# Patient Record
Sex: Male | Born: 1970 | Race: Black or African American | Hispanic: No | Marital: Single | Smoking: Never smoker
Health system: Southern US, Community
[De-identification: ages and names within clinical notes are randomized; demographics above are authoritative.]

## PROBLEM LIST (undated history)

## (undated) DIAGNOSIS — M549 Dorsalgia, unspecified: Secondary | ICD-10-CM

## (undated) DIAGNOSIS — E785 Hyperlipidemia, unspecified: Secondary | ICD-10-CM

## (undated) DIAGNOSIS — I10 Essential (primary) hypertension: Secondary | ICD-10-CM

## (undated) DIAGNOSIS — N529 Male erectile dysfunction, unspecified: Secondary | ICD-10-CM

## (undated) HISTORY — DX: Hyperlipidemia, unspecified: E78.5

## (undated) HISTORY — PX: HEMORROIDECTOMY: SUR656

## (undated) HISTORY — DX: Male erectile dysfunction, unspecified: N52.9

## (undated) HISTORY — DX: Dorsalgia, unspecified: M54.9

## (undated) HISTORY — PX: VASECTOMY: SHX75

## (undated) HISTORY — DX: Essential (primary) hypertension: I10

---

## 2012-10-04 ENCOUNTER — Telehealth: Payer: Self-pay | Admitting: Physician Assistant

## 2012-10-04 NOTE — Telephone Encounter (Signed)
Return call

## 2012-10-04 NOTE — Telephone Encounter (Signed)
Boil has come back. wtbs

## 2012-10-05 ENCOUNTER — Telehealth: Payer: Self-pay | Admitting: Nurse Practitioner

## 2012-10-05 ENCOUNTER — Encounter: Payer: Self-pay | Admitting: Physician Assistant

## 2012-10-05 ENCOUNTER — Ambulatory Visit: Payer: Self-pay | Admitting: Physician Assistant

## 2012-10-05 ENCOUNTER — Ambulatory Visit (INDEPENDENT_AMBULATORY_CARE_PROVIDER_SITE_OTHER): Payer: BC Managed Care – PPO | Admitting: Physician Assistant

## 2012-10-05 VITALS — BP 128/87 | HR 77 | Temp 97.1°F | Ht 73.0 in | Wt 272.8 lb

## 2012-10-05 DIAGNOSIS — L0390 Cellulitis, unspecified: Secondary | ICD-10-CM

## 2012-10-05 MED ORDER — CIPROFLOXACIN HCL 500 MG PO TABS: 500.0000 mg | ORAL_TABLET | Freq: Two times a day (BID) | ORAL | Status: DC

## 2012-10-05 MED ORDER — DOXYCYCLINE HYCLATE 100 MG PO TABS: 100.0000 mg | ORAL_TABLET | Freq: Two times a day (BID) | ORAL | Status: DC

## 2012-10-05 NOTE — Telephone Encounter (Signed)
Pt was checking out and he told me he needed his blood pressure rx refilled.  He has only 3 left. Pt uses Psychologist, forensic.  He doesn't remember the name of the rx

## 2012-10-05 NOTE — Progress Notes (Signed)
  Subjective:    Patient ID: Logan Smith, male    DOB: [birth year], 42 y.o.   MRN: [id number]  HPI    Review of Systems  Skin:       Right axilla starting swell and get tender  All other systems reviewed and are negative.       Objective:   Physical Exam  Skin:  Right axilla 3 cm lesion vesicular head on erythematous base          Assessment & Plan:  Cellulitis right axilla Meds ordered this encounter  Medications  . benazepril-hydrochlorthiazide (LOTENSIN HCT) 10-12.5 MG per tablet    Sig: Take 1 tablet by mouth daily.  . sildenafil (VIAGRA) 100 MG tablet    Sig: Take 100 mg by mouth daily as needed for erectile dysfunction.  Marland Kitchen amLODipine (NORVASC) 5 MG tablet    Sig: Take 5 mg by mouth daily.  . rosuvastatin (CRESTOR) 10 MG tablet    Sig: Take 10 mg by mouth daily.  . fish oil-omega-3 fatty acids 1000 MG capsule    Sig: Take 2 g by mouth daily.  . Multiple Vitamins-Minerals (MENS 50+ MULTI VITAMIN/MIN PO)    Sig: Take by mouth.  . ciprofloxacin (CIPRO) 500 MG tablet    Sig: Take 1 tablet (500 mg total) by mouth 2 (two) times daily.    Dispense:  20 tablet    Refill:  0    Order Specific Question:  Supervising Provider    Answer:  Ernestina Penna [[id number]]  . doxycycline (VIBRA-TABS) 100 MG tablet    Sig: Take 1 tablet (100 mg total) by mouth 2 (two) times daily.    Dispense:  20 tablet    Refill:  0    Order Specific Question:  Supervising Provider    Answer:  Ernestina Penna [phone]

## 2012-10-05 NOTE — Telephone Encounter (Signed)
Spoke with patient and told him to come in this morning for evaluation.

## 2012-10-06 NOTE — Telephone Encounter (Signed)
Pt will contact [facility] to send name of meds to [place] for refills

## 2012-11-24 ENCOUNTER — Ambulatory Visit: Payer: Self-pay | Admitting: Nurse Practitioner

## 2013-01-05 ENCOUNTER — Ambulatory Visit (INDEPENDENT_AMBULATORY_CARE_PROVIDER_SITE_OTHER): Payer: BC Managed Care – PPO | Admitting: Nurse Practitioner

## 2013-01-05 ENCOUNTER — Encounter: Payer: Self-pay | Admitting: Nurse Practitioner

## 2013-01-05 VITALS — BP 133/80 | HR 69 | Temp 98.2°F | Ht 73.0 in | Wt 273.0 lb

## 2013-01-05 DIAGNOSIS — I10 Essential (primary) hypertension: Secondary | ICD-10-CM | POA: Insufficient documentation

## 2013-01-05 DIAGNOSIS — N529 Male erectile dysfunction, unspecified: Secondary | ICD-10-CM

## 2013-01-05 DIAGNOSIS — E785 Hyperlipidemia, unspecified: Secondary | ICD-10-CM | POA: Insufficient documentation

## 2013-01-05 LAB — COMPLETE METABOLIC PANEL WITH GFR
ALT: 27 U/L (ref 0–53)
AST: 20 U/L (ref 0–37)
Albumin: 4.2 g/dL (ref 3.5–5.2)
Alkaline Phosphatase: 60 U/L (ref 39–117)
Calcium: 9.3 mg/dL (ref 8.4–10.5)
Chloride: 103 mEq/L (ref 96–112)
Potassium: 3.7 mEq/L (ref 3.5–5.3)
Sodium: 138 mEq/L (ref 135–145)
Total Protein: 6.9 g/dL (ref 6.0–8.3)

## 2013-01-05 MED ORDER — BENAZEPRIL-HYDROCHLOROTHIAZIDE 10-12.5 MG PO TABS: 1.0000 | ORAL_TABLET | Freq: Every day | ORAL | Status: DC

## 2013-01-05 MED ORDER — AMLODIPINE BESYLATE 5 MG PO TABS: 5.0000 mg | ORAL_TABLET | Freq: Every day | ORAL | Status: DC

## 2013-01-05 NOTE — Patient Instructions (Addendum)

## 2013-01-05 NOTE — Progress Notes (Signed)
  Subjective:    Patient ID: Donn Zanetti, male    DOB: 05/30/1971, 42 y.o.   MRN: [id number]  Hypertension This is a chronic problem. The current episode started more than 1 year ago. The problem has been gradually improving since onset. The problem is controlled. Pertinent negatives include no anxiety, blurred vision, chest pain, palpitations or shortness of breath. Risk factors for coronary artery disease include male gender and dyslipidemia. Past treatments include calcium channel blockers, ACE inhibitors and diuretics. There is no history of a thyroid problem.  Hyperlipidemia This is a chronic problem. The current episode started more than 1 year ago. The problem is uncontrolled. Recent lipid tests were reviewed and are high. Pertinent negatives include no chest pain or shortness of breath. Current antihyperlipidemic treatment includes statins. The current treatment provides mild improvement of lipids. Risk factors for coronary artery disease include dyslipidemia, hypertension and male sex.      Review of Systems  Eyes: Negative for blurred vision.  Respiratory: Negative for shortness of breath.   Cardiovascular: Negative for chest pain and palpitations.  All other systems reviewed and are negative.       Objective:   Physical Exam  Constitutional: He is oriented to person, place, and time. He appears well-developed and well-nourished.  HENT:  Head: Normocephalic.  Eyes: Pupils are equal, round, and reactive to light.  Neck: Normal range of motion. Neck supple.  Cardiovascular: Normal rate, regular rhythm, normal heart sounds and intact distal pulses.   Pulmonary/Chest: Effort normal and breath sounds normal.  Abdominal: Soft. Bowel sounds are normal. There is no tenderness.  Musculoskeletal: Normal range of motion. He exhibits no edema.  Neurological: He is alert and oriented to person, place, and time.  Skin: Skin is warm and dry.  Psychiatric: He has a normal mood and  affect. His behavior is normal. Judgment and thought content normal.    BP 133/80  Pulse 69  Temp(Src) 98.2 F (36.8 C) (Oral)  Ht 6\' 1"  (1.854 m)  Wt 273 lb (123.832 kg)  BMI 36.03 kg/m2       Assessment & Plan:   1. Hypertension   2. Hyperlipemia   3. ED (erectile dysfunction)    Orders Placed This Encounter  Procedures  . COMPLETE METABOLIC PANEL WITH GFR  . NMR Lipoprofile with Lipids   Meds ordered this encounter  Medications  . amLODipine (NORVASC) 5 MG tablet    Sig: Take 1 tablet (5 mg total) by mouth daily.    Dispense:  30 tablet    Refill:  5    Order Specific Question:  Supervising Provider    Answer:  Ernestina Penna [[id number]]  . benazepril-hydrochlorthiazide (LOTENSIN HCT) 10-12.5 MG per tablet    Sig: Take 1 tablet by mouth daily.    Dispense:  30 tablet    Refill:  5    Order Specific Question:  Supervising Provider    Answer:  Ernestina Penna [[id number]]   Continue all meds Crestor samples given Diet and exercsie encouraged Follow up in 3 months  Mary-Margaret Daphine Deutscher, FNP

## 2013-01-08 LAB — NMR LIPOPROFILE WITH LIPIDS
HDL Size: 8.4 nm — ABNORMAL LOW (ref >=9.2)
LDL Particle Number: 1034 nmol/L — ABNORMAL HIGH (ref <1000)
LDL Size: 19.8 nm — ABNORMAL LOW (ref >20.5)
Large HDL-P: <1.3 umol/L — ABNORMAL LOW (ref >=4.8)
Large VLDL-P: 1.8 nmol/L (ref <=2.7)

## 2013-01-08 NOTE — Progress Notes (Signed)
Quick Note:  Pt aware ______ 

## 2013-01-10 ENCOUNTER — Telehealth: Payer: Self-pay | Admitting: Nurse Practitioner

## 2013-01-11 NOTE — Telephone Encounter (Signed)
LMOM, none of his  BP meds available in samples.

## 2013-01-29 ENCOUNTER — Other Ambulatory Visit: Payer: Self-pay | Admitting: *Deleted

## 2013-01-29 MED ORDER — ROSUVASTATIN CALCIUM 10 MG PO TABS: 10.0000 mg | ORAL_TABLET | Freq: Every day | ORAL | Status: DC

## 2013-03-16 ENCOUNTER — Encounter: Payer: Self-pay | Admitting: Physician Assistant

## 2013-03-16 ENCOUNTER — Ambulatory Visit (INDEPENDENT_AMBULATORY_CARE_PROVIDER_SITE_OTHER): Payer: BC Managed Care – PPO | Admitting: Physician Assistant

## 2013-03-16 VITALS — BP 134/88 | HR 88 | Temp 97.6°F | Ht 73.0 in | Wt 265.0 lb

## 2013-03-16 DIAGNOSIS — R05 Cough: Secondary | ICD-10-CM

## 2013-03-16 DIAGNOSIS — R059 Cough, unspecified: Secondary | ICD-10-CM

## 2013-03-16 NOTE — Progress Notes (Signed)
  Subjective:    Patient ID: Logan Smith, male    DOB: 1971/01/15, 42 y.o.   MRN: [id number]  HPI  42 y/o male presents for cough x 2 weeks. No aggravating or relieving factors. He would also like his BP checked.      Review of Systems  Constitutional: Negative.   HENT: Negative.   Eyes: Negative.   Respiratory: Positive for cough (nonproductive).   Cardiovascular: Negative.        Objective:   Physical Exam  Constitutional: He appears well-developed and well-nourished. No distress.  HENT:  Head: Normocephalic and atraumatic.  Cardiovascular: Normal rate and regular rhythm.   Pulmonary/Chest: Effort normal and breath sounds normal. No respiratory distress. He has no wheezes.  Skin: He is not diaphoretic.          Assessment & Plan:  1. Seasonal Allergies: Suggested taking OTC antihistamine such as CLaritin, Zyrtec. Work note given

## 2013-04-09 ENCOUNTER — Encounter: Payer: Self-pay | Admitting: Nurse Practitioner

## 2013-04-09 ENCOUNTER — Ambulatory Visit (INDEPENDENT_AMBULATORY_CARE_PROVIDER_SITE_OTHER): Payer: BC Managed Care – PPO | Admitting: Nurse Practitioner

## 2013-04-09 VITALS — BP 128/82 | HR 75 | Temp 97.0°F | Ht 73.0 in | Wt 278.0 lb

## 2013-04-09 DIAGNOSIS — E785 Hyperlipidemia, unspecified: Secondary | ICD-10-CM

## 2013-04-09 DIAGNOSIS — Z125 Encounter for screening for malignant neoplasm of prostate: Secondary | ICD-10-CM

## 2013-04-09 DIAGNOSIS — N529 Male erectile dysfunction, unspecified: Secondary | ICD-10-CM

## 2013-04-09 DIAGNOSIS — I10 Essential (primary) hypertension: Secondary | ICD-10-CM

## 2013-04-09 MED ORDER — HYDROCORTISONE 2.5 % RE CREA: TOPICAL_CREAM | Freq: Two times a day (BID) | RECTAL | Status: DC

## 2013-04-09 NOTE — Patient Instructions (Addendum)

## 2013-04-09 NOTE — Progress Notes (Signed)
  Subjective:    Patient ID: Logan Smith, male    DOB: 1970/09/13, 42 y.o.   MRN: [id number]  Hypertension This is a chronic problem. The current episode started more than 1 year ago. The problem has been gradually improving since onset. The problem is controlled. Pertinent negatives include no anxiety, blurred vision, chest pain, palpitations or shortness of breath. Risk factors for coronary artery disease include male gender, dyslipidemia and family history. Past treatments include calcium channel blockers, ACE inhibitors and diuretics. The current treatment provides mild improvement. There is no history of a thyroid problem.  Hyperlipidemia This is a chronic problem. The current episode started more than 1 year ago. The problem is uncontrolled. Recent lipid tests were reviewed and are high. Pertinent negatives include no chest pain or shortness of breath. Current antihyperlipidemic treatment includes statins. The current treatment provides mild improvement of lipids. Risk factors for coronary artery disease include dyslipidemia, hypertension, male sex and family history.   *Pt also has hemorrhoids and has tried OTC with mild relief. Pt still having burning and pain. Would like a RX?   Review of Systems  Eyes: Negative for blurred vision.  Respiratory: Negative for shortness of breath.   Cardiovascular: Negative for chest pain and palpitations.  All other systems reviewed and are negative.       Objective:   Physical Exam  Vitals reviewed. Constitutional: He is oriented to person, place, and time. He appears well-developed and well-nourished.  HENT:  Head: Normocephalic.  Eyes: Pupils are equal, round, and reactive to light.  Neck: Normal range of motion. Neck supple.  Cardiovascular: Normal rate, regular rhythm, normal heart sounds and intact distal pulses.   Pulmonary/Chest: Effort normal and breath sounds normal.  Abdominal: Soft. Bowel sounds are normal. There is no tenderness.   Musculoskeletal: Normal range of motion. He exhibits no edema.  Neurological: He is alert and oriented to person, place, and time.  Skin: Skin is warm and dry.  Psychiatric: He has a normal mood and affect. His behavior is normal. Judgment and thought content normal.    BP 128/82  Pulse 75  Temp(Src) 97 F (36.1 C) (Oral)  Ht 6\' 1"  (1.854 m)  Wt 278 lb (126.1 kg)  BMI 36.69 kg/m2       Assessment & Plan:   1. Hypertension   2. Hyperlipemia   3. ED (erectile dysfunction)   4. Prostate cancer screening    Orders Placed This Encounter  Procedures  . CMP14+EGFR  . NMR, lipoprofile  . PSA, total and free    Continue all meds Labs pending Diet and exercise encouraged Health maintenance reviewed Follow up in 3 months  Mary-Margaret Daphine Deutscher, FNP

## 2013-04-11 LAB — CMP14+EGFR
ALT: 17 IU/L (ref 0–44)
AST: 16 IU/L (ref 0–40)
Alkaline Phosphatase: 72 IU/L (ref 39–117)
BUN/Creatinine Ratio: 12 (ref 9–20)
Chloride: 97 mmol/L (ref 97–108)
GFR calc Af Amer: 131 mL/min/{1.73_m2} (ref >59)
Potassium: 3.8 mmol/L (ref 3.5–5.2)
Sodium: 139 mmol/L (ref 134–144)
Total Bilirubin: 0.4 mg/dL (ref 0.0–1.2)

## 2013-04-11 LAB — NMR, LIPOPROFILE
HDL Particle Number: 33.8 umol/L (ref >=30.5)
LDL Particle Number: 1179 nmol/L — ABNORMAL HIGH (ref <1000)
Small LDL Particle Number: 885 nmol/L — ABNORMAL HIGH (ref <=527)
Triglycerides by NMR: 100 mg/dL (ref <150)

## 2013-04-11 LAB — PSA, TOTAL AND FREE: PSA: 1.7 ng/mL (ref 0.0–4.0)

## 2013-05-17 ENCOUNTER — Other Ambulatory Visit: Payer: Self-pay | Admitting: Nurse Practitioner

## 2013-06-14 ENCOUNTER — Telehealth: Payer: Self-pay | Admitting: Nurse Practitioner

## 2013-06-14 NOTE — Telephone Encounter (Signed)
Tramadol not on llist- what does he need for?

## 2013-06-19 NOTE — Telephone Encounter (Signed)
WE HAVE NOT GIVEN HIM TRMADOL OR MOBIC SINCE BEING ON EPIC- ntbs

## 2013-06-28 NOTE — Telephone Encounter (Signed)
Patient aware has an appointment on the 07/20/13 will call back if needs to be seen sooner

## 2013-07-19 ENCOUNTER — Other Ambulatory Visit: Payer: Self-pay | Admitting: Nurse Practitioner

## 2013-07-20 ENCOUNTER — Ambulatory Visit: Payer: BC Managed Care – PPO | Admitting: Nurse Practitioner

## 2013-07-21 ENCOUNTER — Other Ambulatory Visit: Payer: Self-pay | Admitting: Nurse Practitioner

## 2013-08-03 ENCOUNTER — Ambulatory Visit (INDEPENDENT_AMBULATORY_CARE_PROVIDER_SITE_OTHER): Payer: BC Managed Care – PPO | Admitting: Nurse Practitioner

## 2013-08-03 ENCOUNTER — Encounter (INDEPENDENT_AMBULATORY_CARE_PROVIDER_SITE_OTHER): Payer: Self-pay

## 2013-08-03 ENCOUNTER — Encounter: Payer: Self-pay | Admitting: Nurse Practitioner

## 2013-08-03 VITALS — BP 131/82 | HR 86 | Temp 96.9°F | Ht 73.0 in | Wt 265.0 lb

## 2013-08-03 DIAGNOSIS — N529 Male erectile dysfunction, unspecified: Secondary | ICD-10-CM

## 2013-08-03 DIAGNOSIS — I10 Essential (primary) hypertension: Secondary | ICD-10-CM

## 2013-08-03 DIAGNOSIS — E785 Hyperlipidemia, unspecified: Secondary | ICD-10-CM

## 2013-08-03 MED ORDER — MELOXICAM 15 MG PO TABS: 15.0000 mg | ORAL_TABLET | Freq: Every day | ORAL | Status: DC

## 2013-08-03 NOTE — Progress Notes (Signed)
Subjective:    Patient ID: Logan Smith, male    DOB: 1971/01/10, 43 y.o.   MRN: [id number]  Pateint in today fro follow up- doing well- he went to ER with back pain- was given pain meds and muscle relaxer and still having occasional flareups.  Hypertension This is a chronic problem. The current episode started more than 1 year ago. The problem has been gradually improving since onset. The problem is controlled. Pertinent negatives include no anxiety, blurred vision, chest pain, palpitations or shortness of breath. Risk factors for coronary artery disease include male gender, dyslipidemia and family history. Past treatments include calcium channel blockers, ACE inhibitors and diuretics. The current treatment provides mild improvement. There is no history of a thyroid problem.  Hyperlipidemia This is a chronic problem. The current episode started more than 1 year ago. The problem is uncontrolled. Recent lipid tests were reviewed and are high. Pertinent negatives include no chest pain or shortness of breath. Current antihyperlipidemic treatment includes statins. The current treatment provides mild improvement of lipids. Risk factors for coronary artery disease include dyslipidemia, hypertension, male sex and family history.  Back Pain This is a recurrent problem. The current episode started more than 1 month ago. The problem occurs intermittently. The problem is unchanged. The pain is present in the lumbar spine. The quality of the pain is described as aching. The pain does not radiate. The pain is at a severity of 4/10. The pain is mild. The pain is worse during the night. The symptoms are aggravated by lying down. Pertinent negatives include no chest pain. He has tried muscle relaxant and analgesics for the symptoms. The treatment provided mild relief.    Review of Systems  Eyes: Negative for blurred vision.  Respiratory: Negative for shortness of breath.   Cardiovascular: Negative for chest pain  and palpitations.  Musculoskeletal: Positive for back pain.  All other systems reviewed and are negative.       Objective:   Physical Exam  Vitals reviewed. Constitutional: He is oriented to person, place, and time. He appears well-developed and well-nourished.  HENT:  Head: Normocephalic.  Eyes: Pupils are equal, round, and reactive to light.  Neck: Normal range of motion. Neck supple.  Cardiovascular: Normal rate, regular rhythm, normal heart sounds and intact distal pulses.   Pulmonary/Chest: Effort normal and breath sounds normal.  Abdominal: Soft. Bowel sounds are normal. There is no tenderness.  Musculoskeletal: Normal range of motion. He exhibits no edema.  Neurological: He is alert and oriented to person, place, and time.  Skin: Skin is warm and dry.  Psychiatric: He has a normal mood and affect. His behavior is normal. Judgment and thought content normal.    BP 131/82  Pulse 86  Temp(Src) 96.9 F (36.1 C) (Oral)  Ht '6\' 1"'  (1.854 m)  Wt 265 lb (120.203 kg)  BMI 34.97 kg/m2       Assessment & Plan:   1. Hypertension   2. Hyperlipemia   3. ED (erectile dysfunction)    Orders Placed This Encounter  Procedures  . CMP14+EGFR  . NMR, lipoprofile   Meds ordered this encounter  Medications  . meloxicam (MOBIC) 15 MG tablet    Sig: Take 1 tablet (15 mg total) by mouth daily.    Dispense:  30 tablet    Refill:  2    Order Specific Question:  Supervising Provider    Answer:  Chipper Herb [[id number]]   Back strengthening exercises Labs pending Health maintenance reviewed Diet  and exercise encouraged Continue all meds Follow up  In 3 months   [place], FNP

## 2013-08-03 NOTE — Patient Instructions (Signed)
Back Exercises Back exercises help treat and prevent back injuries. The goal of back exercises is to increase the strength of your abdominal and back muscles and the flexibility of your back. These exercises should be started when you no longer have back pain. Back exercises include:  Pelvic Tilt. Lie on your back with your knees bent. Tilt your pelvis until the lower part of your back is against the floor. Hold this position 5 to 10 sec and repeat 5 to 10 times.  Knee to Chest. Pull first 1 knee up against your chest and hold for 20 to 30 seconds, repeat this with the other knee, and then both knees. This may be done with the other leg straight or bent, whichever feels better.  Sit-Ups or Curl-Ups. Bend your knees 90 degrees. Start with tilting your pelvis, and do a partial, slow sit-up, lifting your trunk only 30 to 45 degrees off the floor. Take at least 2 to 3 seconds for each sit-up. Do not do sit-ups with your knees out straight. If partial sit-ups are difficult, simply do the above but with only tightening your abdominal muscles and holding it as directed.  Hip-Lift. Lie on your back with your knees flexed 90 degrees. Push down with your feet and shoulders as you raise your hips a couple inches off the floor; hold for 10 seconds, repeat 5 to 10 times.  Back arches. Lie on your stomach, propping yourself up on bent elbows. Slowly press on your hands, causing an arch in your low back. Repeat 3 to 5 times. Any initial stiffness and discomfort should lessen with repetition over time.  Shoulder-Lifts. Lie face down with arms beside your body. Keep hips and torso pressed to floor as you slowly lift your head and shoulders off the floor. Do not overdo your exercises, especially in the beginning. Exercises may cause you some mild back discomfort which lasts for a few minutes; however, if the pain is more severe, or lasts for more than 15 minutes, do not continue exercises until you see your caregiver.  Improvement with exercise therapy for back problems is slow.  See your caregivers for assistance with developing a proper back exercise program. Document Released: 08/05/2004 Document Revised: 09/20/2011 Document Reviewed: 04/29/2011 [employer] Patient Information 2014 [employer]  

## 2013-08-05 LAB — CMP14+EGFR
A/G RATIO: 1.7 (ref 1.1–2.5)
ALT: 19 IU/L (ref 0–44)
AST: 20 IU/L (ref 0–40)
Albumin: 4.3 g/dL (ref 3.5–5.5)
Alkaline Phosphatase: 77 IU/L (ref 39–117)
BILIRUBIN TOTAL: 0.4 mg/dL (ref 0.0–1.2)
BUN/Creatinine Ratio: 10 (ref 9–20)
BUN: 10 mg/dL (ref 6–24)
CALCIUM: 9.4 mg/dL (ref 8.7–10.2)
CO2: 25 mmol/L (ref 18–29)
Chloride: 98 mmol/L (ref 97–108)
Creatinine, Ser: 1.01 mg/dL (ref 0.76–1.27)
GFR calc Af Amer: 106 mL/min/{1.73_m2} (ref >59)
GFR, EST NON AFRICAN AMERICAN: 92 mL/min/{1.73_m2} (ref >59)
Globulin, Total: 2.6 g/dL (ref 1.5–4.5)
Glucose: 113 mg/dL — ABNORMAL HIGH (ref 65–99)
POTASSIUM: 3.9 mmol/L (ref 3.5–5.2)
SODIUM: 141 mmol/L (ref 134–144)
Total Protein: 6.9 g/dL (ref 6.0–8.5)

## 2013-08-05 LAB — NMR, LIPOPROFILE
Cholesterol: 112 mg/dL (ref <200)
HDL Cholesterol by NMR: 37 mg/dL — ABNORMAL LOW (ref >=40)
HDL PARTICLE NUMBER: 34.6 umol/L (ref >=30.5)
LDL PARTICLE NUMBER: 919 nmol/L (ref <1000)
LDL SIZE: 19.8 nm — AB (ref >20.5)
LDLC SERPL CALC-MCNC: 47 mg/dL (ref <100)
LP-IR SCORE: 44 (ref <=45)
Small LDL Particle Number: 652 nmol/L — ABNORMAL HIGH (ref <=527)
Triglycerides by NMR: 138 mg/dL (ref <150)

## 2013-08-23 ENCOUNTER — Telehealth: Payer: Self-pay | Admitting: Nurse Practitioner

## 2013-08-24 NOTE — Telephone Encounter (Signed)
No samples at this time

## 2013-08-30 ENCOUNTER — Telehealth: Payer: Self-pay | Admitting: Nurse Practitioner

## 2013-08-30 NOTE — Telephone Encounter (Signed)
Patient aware to pick up 

## 2013-09-18 ENCOUNTER — Telehealth: Payer: Self-pay | Admitting: Nurse Practitioner

## 2013-09-19 MED ORDER — ROSUVASTATIN CALCIUM 10 MG PO TABS: 10.0000 mg | ORAL_TABLET | Freq: Every day | ORAL | Status: DC

## 2013-09-19 NOTE — Telephone Encounter (Signed)
Samples of crestor at front.

## 2013-09-19 NOTE — Telephone Encounter (Signed)
done

## 2013-09-28 ENCOUNTER — Telehealth: Payer: Self-pay | Admitting: Nurse Practitioner

## 2013-09-28 NOTE — Telephone Encounter (Signed)
Nothing else stronger

## 2013-10-03 ENCOUNTER — Other Ambulatory Visit: Payer: Self-pay | Admitting: *Deleted

## 2013-10-03 MED ORDER — BENAZEPRIL-HYDROCHLOROTHIAZIDE 10-12.5 MG PO TABS: ORAL_TABLET | ORAL | Status: DC

## 2013-10-03 MED ORDER — AMLODIPINE BESYLATE 5 MG PO TABS: ORAL_TABLET | ORAL | Status: DC

## 2013-10-03 NOTE — Telephone Encounter (Signed)
Patient advised. Also suggested Tucks pads and sitz bath.

## 2013-10-16 ENCOUNTER — Telehealth: Payer: Self-pay | Admitting: Nurse Practitioner

## 2013-10-16 MED ORDER — HYDROCORTISONE 2.5 % RE CREA: TOPICAL_CREAM | RECTAL | Status: DC

## 2013-10-16 NOTE — Telephone Encounter (Signed)
rx sent to pharmacy

## 2013-11-05 ENCOUNTER — Other Ambulatory Visit: Payer: Self-pay

## 2013-11-05 ENCOUNTER — Telehealth: Payer: Self-pay | Admitting: Nurse Practitioner

## 2013-11-05 MED ORDER — MELOXICAM 15 MG PO TABS: 15.0000 mg | ORAL_TABLET | Freq: Every day | ORAL | Status: DC

## 2013-11-05 NOTE — Telephone Encounter (Signed)
Yes patient is talking about mobic

## 2013-11-05 NOTE — Telephone Encounter (Signed)
Does he mean mobic- he is not on any muscle relaxers according to chart

## 2013-11-12 ENCOUNTER — Telehealth: Payer: Self-pay | Admitting: Nurse Practitioner

## 2013-11-12 DIAGNOSIS — K649 Unspecified hemorrhoids: Secondary | ICD-10-CM

## 2013-11-12 DIAGNOSIS — E785 Hyperlipidemia, unspecified: Secondary | ICD-10-CM

## 2013-11-12 MED ORDER — ROSUVASTATIN CALCIUM 10 MG PO TABS: 10.0000 mg | ORAL_TABLET | Freq: Every day | ORAL | Status: DC

## 2013-11-12 NOTE — Telephone Encounter (Signed)
Referral made to general surgery for hemorrhoids and rx snet in for crestor rx- can pick up crestor coupon if needed

## 2013-11-12 NOTE — Telephone Encounter (Signed)
NO SAMPLES AT THIS TIME.... Please review about referral?

## 2013-11-28 ENCOUNTER — Ambulatory Visit (INDEPENDENT_AMBULATORY_CARE_PROVIDER_SITE_OTHER): Payer: BC Managed Care – PPO | Admitting: Surgery

## 2013-11-28 ENCOUNTER — Encounter (INDEPENDENT_AMBULATORY_CARE_PROVIDER_SITE_OTHER): Payer: Self-pay | Admitting: Surgery

## 2013-11-28 ENCOUNTER — Encounter (INDEPENDENT_AMBULATORY_CARE_PROVIDER_SITE_OTHER): Payer: Self-pay

## 2013-11-28 VITALS — BP 142/82 | HR 74 | Resp 18 | Ht 72.0 in | Wt 251.0 lb

## 2013-11-28 DIAGNOSIS — A630 Anogenital (venereal) warts: Secondary | ICD-10-CM | POA: Insufficient documentation

## 2013-11-28 DIAGNOSIS — K648 Other hemorrhoids: Secondary | ICD-10-CM | POA: Insufficient documentation

## 2013-11-28 NOTE — Progress Notes (Signed)
Subjective:     Patient ID: Logan Smith, male   DOB: 11-06-1970, 43 y.o.   MRN: [id number]  HPI  Note: This dictation was prepared with Dragon/digital dictation along with [facility] technology. Any transcriptional errors that result from this process are unintentional.       Logan Smith  [birth year] [id number]  Patient Care Team: Chipper Herb, MD as PCP - General (Family Medicine) Mary-Margaret Hassell Done, FNP as Nurse Practitioner (Nurse Practitioner)  This patient is a 43 y.o.male who presents today for surgical evaluation at the request of [place], [place].   Reason for visit: Hemorrhoids  Young male.  Arrived late to clinic.  We fit him in.  He began to notice some irritation around the anus 8 months ago.  No severe pain or drainage.  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  He has been using over-the-counter creams and Vaseline.  Normally has a bowel movement every other day.  No history of prior anorectal interventions.  No severe bleeding.  Eyes any history of anal sex.  Has had unprotected sex in the past.  Does not know of any sexual transmitted diseases.  This is felt to be hemorrhoids.  Surgical consultation recommended.  No personal nor family history of GI/colon cancer, inflammatory bowel disease, irritable bowel syndrome, allergy such as Celiac Sprue, dietary/dairy problems, colitis, ulcers nor gastritis.  No recent sick contacts/gastroenteritis.  No travel outside the country.  No changes in diet.  No dysphagia to solids or liquids.  No significant heartburn or reflux.  No hematochezia, hematemesis, coffee ground emesis.  No evidence of prior gastric/peptic ulceration.    Patient Active Problem List   Diagnosis Date Noted  . Hypertension 01/05/2013  . Hyperlipemia 01/05/2013  . ED (erectile dysfunction) 01/05/2013    Past Medical History  Diagnosis Date  . Hyperlipidemia   . Hypertension   . Erectile dysfunction     History  reviewed. No pertinent past surgical history.  History   Social History  . Marital Status: Single    Spouse Name: N/A    Number of Children: N/A  . Years of Education: N/A   Occupational History  . Not on file.   Social History Main Topics  . Smoking status: Never Smoker   . Smokeless tobacco: Not on file  . Alcohol Use: No     Comment: rarely  . Drug Use: No  . Sexual Activity: Not on file   Other Topics Concern  . Not on file   Social History Narrative  . No narrative on file    Family History  Problem Relation Age of Onset  . Diabetes Mother   . Hypertension Mother   . Hypertension Father     Current Outpatient Prescriptions  Medication Sig Dispense Refill  . amLODipine (NORVASC) 5 MG tablet TAKE 1 TABLET EVERY DAY  30 tablet  1  . benazepril-hydrochlorthiazide (LOTENSIN HCT) 10-12.5 MG per tablet TAKE 1 TABLET DAILY  30 tablet  1  . fish oil-omega-3 fatty acids 1000 MG capsule Take 2 g by mouth daily.      . hydrocortisone (PROCTOSOL HC) 2.5 % rectal cream PLACE RECTALLY 2 (TWO) TIMES DAILY.  28.35 g  1  . meloxicam (MOBIC) 15 MG tablet Take 1 tablet (15 mg total) by mouth daily.  30 tablet  1  . Multiple Vitamins-Minerals (MENS 50+ MULTI VITAMIN/MIN PO) Take by mouth.      . rosuvastatin (CRESTOR) 10 MG tablet  Take 1 tablet (10 mg total) by mouth daily.  30 tablet  5  . sildenafil (VIAGRA) 100 MG tablet Take 100 mg by mouth daily as needed for erectile dysfunction.       No current facility-administered medications for this visit.     No Known Allergies  BP 142/82  Pulse 74  Resp 18  Ht 6' (1.829 m)  Wt 251 lb (113.853 kg)  BMI 34.03 kg/m2  No results found.   Review of Systems  Constitutional: Negative for fever, chills and diaphoresis.  HENT: Negative for ear discharge, facial swelling, mouth sores, nosebleeds, sore throat and trouble swallowing.   Eyes: Negative for photophobia, discharge and visual disturbance.  Respiratory: Negative for  choking, chest tightness, shortness of breath and stridor.   Cardiovascular: Negative for chest pain and palpitations.  Gastrointestinal: Positive for constipation and rectal pain. Negative for nausea, vomiting, abdominal pain, diarrhea, blood in stool, abdominal distention and anal bleeding.  Endocrine: Negative for cold intolerance and heat intolerance.  Genitourinary: Negative for dysuria, urgency, difficulty urinating and testicular pain.  Musculoskeletal: Negative for arthralgias, back pain, gait problem and myalgias.  Skin: Negative for color change, pallor, rash and wound.  Allergic/Immunologic: Negative for environmental allergies and food allergies.  Neurological: Negative for dizziness, speech difficulty, weakness, numbness and headaches.  Hematological: Negative for adenopathy. Does not bruise/bleed easily.  Psychiatric/Behavioral: Negative for hallucinations, confusion and agitation.       Objective:   Physical Exam  Constitutional: He is oriented to person, place, and time. He appears well-developed and well-nourished. No distress.  HENT:  Head: Normocephalic.  Mouth/Throat: Oropharynx is clear and moist. No oropharyngeal exudate.  Eyes: Conjunctivae and EOM are normal. Pupils are equal, round, and reactive to light. No scleral icterus.  Neck: Normal range of motion. Neck supple. No tracheal deviation present.  Cardiovascular: Normal rate, regular rhythm and intact distal pulses.   Pulmonary/Chest: Effort normal and breath sounds normal. No respiratory distress.  Abdominal: Soft. He exhibits no distension. There is no tenderness. Hernia confirmed negative in the right inguinal area and confirmed negative in the left inguinal area.  Genitourinary: Rectal exam shows mass.     Exam done with assistance of male Medical Assistant in the room.  Perianal skin clean with good hygiene.    No pilonidal disease.  No fissure.  No abscess/fistula.  No external hemorrhoids.   Normal  sphincter tone.  Tolerates digital rectal exam.   Hemorrhoidal piles mild/moderately enlarged  Obvious carpeting of condyloma perianally and up into the anal canal.  Mild pruritis.  Musculoskeletal: Normal range of motion. He exhibits no tenderness.  Lymphadenopathy:    He has no cervical adenopathy.       Right: No inguinal adenopathy present.       Left: No inguinal adenopathy present.  Neurological: He is alert and oriented to person, place, and time. No cranial nerve deficit. He exhibits normal muscle tone. Coordination normal.  Skin: Skin is warm and dry. No rash noted. He is not diaphoretic. No erythema. No pallor.  Psychiatric: He has a normal mood and affect. His behavior is normal. Judgment and thought content normal.       Assessment:     Perianal masses consistent with condylomata.  Mild internal hemorrhoids.  Doubt that they are the source of the problem.     Plan:     I think he would benefit from removal of masses.  There too numerous and extensive to turn just in the  office.  Therefore recommended ablation in the operating room.  Plan with CO2 laser with biopsy to prove condylomata.  He is interested in proceeding.  This allowed opportunity more aggressive internal examination to see if there are truly significant hemorrhoids that need removal or ligation:  The anatomy & physiology of the anorectal region was discussed.  The pathophysiology of anorectal warts and differential diagnosis was discussed.  Natural history risks without surgery was discussed such as further growth and cancer.   I stressed the importance of office follow-up to catch early recurrence & minimize/halt progression of disease.  Interventions such as cauterization by topical agents were discussed.  The patient's symptoms are not adequately controlled by non-operative treatments.  I feel the risks & problems of no surgery outweigh the operative risks; therefore, I recommended surgery to treat the anal  warts by removal, ablation and/or cauterization.  Risks such as bleeding, infection, need for further treatment, heart attack, death, and other risks were discussed.   I noted a good likelihood this will help address the problem. Goals of post-operative recovery were discussed as well.  Possibility that this will not correct all symptoms was explained.  Post-operative pain, bleeding, constipation, and other problems after surgery were discussed.  We will work to minimize complications.   Educational handouts further explaining the pathology, treatment options, and bowel regimen were given as well.  Questions were answered.  The patient expresses understanding & wishes to proceed with surgery.

## 2013-11-28 NOTE — Patient Instructions (Signed)
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  Possible hemorrhoid ligation/hemorrhoidectomy as well.  Start on fiber supplement to improve mild constipation.  Goal is one bowel movement a day.  See the Handout(s) we have given you.  Please call our office at [phone] if you wish to schedule surgery or if you have further questions / concerns.   Recommended surgeon followup physical exam schedule for condyloma/warts: -every 6 months until negative exam x2, then -every Year until negative exam x2, then -as needed thereafter   Genital Warts Genital warts are a sexually transmitted infection. They may appear as small bumps on the tissues of the genital area. CAUSES  Genital warts are caused by a virus called human papillomavirus (HPV). HPV is the most common sexually transmitted disease (STD) and infection of the sex organs. This infection is spread by having unprotected sex with an infected person. It can be spread by vaginal, anal, and oral sex. Many people do not know they are infected. They may be infected for years without problems. However, even if they do not have problems, they can unknowingly pass the infection to their sexual partners. SYMPTOMS   Itching and irritation in the genital area.Anal wart   Warts that bleed.  Painful sexual intercourse. DIAGNOSIS  Warts are usually recognized with the naked eye on the vagina, vulva, perineum, anus, and rectum. Certain tests can also diagnose genital warts, such as:  A Pap test.  A tissue sample (biopsy) exam.  Colposcopy. A magnifying tool is used to examine the vagina and cervix. The HPV cells will change color when certain solutions are used. TREATMENT  Warts can be removed by:  Applying certain chemicals, such as cantharidin or podophyllin.  Liquid nitrogen freezing (cryotherapy).  Immunotherapy with candida or  trichophyton injections.  Laser treatment.  Burning with an electrified probe (electrocautery).  Interferon injections.  Surgery. PREVENTION  HPV vaccination can help prevent HPV infections that cause genital warts and that cause cancer of the cervix. It is recommended that the vaccination be given to people between the ages 12 to 41 years old. The vaccine might not work as well or might not work at all if you already have HPV. It should not be given to pregnant women. HOME CARE INSTRUCTIONS   It is important to follow your caregiver's instructions. The warts will not go away without treatment. Repeat treatments are often needed to get rid of warts. Even after it appears that the warts are gone, the normal tissue underneath often remains infected.  Do not try to treat genital warts with medicine used to treat hand warts. This type of medicine is strong and can burn the skin in the genital area, causing more damage.  Tell your past and current sexual partner(s) that you have genital warts. They may be infected also and need treatment.  Avoid sexual contact while being treated.  Do not touch or scratch the warts. The infection may spread to other parts of your body.  Women with genital warts should have a cervical cancer check (Pap test) at least once a year. This type of cancer is slow-growing and can be cured if found early. Chances of developing cervical cancer are increased with HPV.  Inform your obstetrician about your warts in the event of pregnancy. This virus can be passed to the baby's respiratory tract. Discuss this with your caregiver.  Use a condom during sexual intercourse.  Following treatment, the use of condoms will help prevent reinfection.  Ask your caregiver about using over-the-counter anti-itch creams. SEEK MEDICAL CARE IF:   Your treated skin becomes red, swollen, or painful.  You have a fever.  You feel generally ill.  You feel little lumps in and around your  genital area.  You are bleeding or have painful sexual intercourse. MAKE SURE YOU:   Understand these instructions.  Will watch your condition.  Will get help right away if you are not doing well or get worse.   ANORECTAL SURGERY: POST OP INSTRUCTIONS  1. Take your usually prescribed home medications unless otherwise directed. 2. DIET: Follow a light bland diet the first 24 hours after arrival home, such as soup, liquids, crackers, etc.  Be sure to include lots of fluids daily.  Avoid fast food or heavy meals as your are more likely to get nauseated.  Eat a low fat the next few days after surgery.   3. PAIN CONTROL: a. Pain is best controlled by a usual combination of three different methods TOGETHER: i. Ice/Heat ii. Over the counter pain medication iii. Prescription pain medication b. Most patients will experience some swelling and discomfort in the anus/rectal area. and incisions.  Ice packs or heat (30-60 minutes up to 6 times a day) will help. Use ice for the first few days to help decrease swelling and bruising, then switch to heat such as warm towels, sitz baths, warm baths, etc to help relax tight/sore spots and speed recovery.  Some people prefer to use ice alone, heat alone, alternating between ice & heat.  Experiment to what works for you.  Swelling and bruising can take several weeks to resolve.   c. It is helpful to take an over-the-counter pain medication regularly for the first few weeks.  Choose one of the following that works best for you: i. Naproxen (Aleve, etc)  Two 220mg  tabs twice a day ii. Ibuprofen (Advil, etc) Three 200mg  tabs four times a day (every meal & bedtime) iii. Acetaminophen (Tylenol, etc) 500-650mg  four times a day (every meal & bedtime) d. A  prescription for pain medication (such as oxycodone, hydrocodone, etc) should be given to you upon discharge.  Take your pain medication as prescribed.  i. If you are having problems/concerns with the prescription  medicine (does not control pain, nausea, vomiting, rash, itching, etc), please call us [phone] to see if we need to switch you to a different pain medicine that will work better for you and/or control your side effect better. ii. If you need a refill on your pain medication, please contact your pharmacy.  They will contact our office to request authorization. Prescriptions will not be filled after 5 pm or on week-ends.  Use a Sitz Bath 4-8 times a day for relief A sitz bath is a warm water bath taken in the sitting position that covers only the hips and buttocks. It may be used for either healing or hygiene purposes. Sitz baths are also used to relieve pain, itching, or muscle spasms. The water may contain medicine. Moist heat will help you heal and relax.  HOME CARE INSTRUCTIONS  Take 3 to 4 sitz baths a day. 1. Fill the bathtub half full with warm water. 2. Sit in the water and open the drain a little. 3. Turn on the warm water to keep the tub half full. Keep the water running constantly. 4. Soak in the water for 15 to 20 minutes. 5. After the  sitz bath, pat the affected area dry first. SEEK MEDICAL CARE IF:  You get worse instead of better. Stop the sitz baths if you get worse.   4. KEEP YOUR BOWELS REGULAR a. The goal is one bowel movement a day b. Avoid getting constipated.  Between the surgery and the pain medications, it is common to experience some constipation.  Increasing fluid intake and taking a fiber supplement (such as Metamucil, Citrucel, FiberCon, MiraLax, etc) 1-2 times a day regularly will usually help prevent this problem from occurring.  A mild laxative (prune juice, Milk of Magnesia, MiraLax, etc) should be taken according to package directions if there are no bowel movements after 48 hours. c. Watch out for diarrhea.  If you have many loose bowel movements, simplify your diet to bland foods & liquids for a few days.  Stop any stool softeners and decrease your fiber  supplement.  Switching to mild anti-diarrheal medications (Kayopectate, Pepto Bismol) can help.  If this worsens or does not improve, please call us.  5. Wound Care a. Remove your bandages the day after surgery.  Unless discharge instructions indicate otherwise, leave your bandage dry and in place overnight.  Remove the bandage during your first bowel movement.   b. Allow the wound packing to fall out over the next few days.  You can trim exposed gauze / ribbon as it falls out.  You do not need to repack the wound unless instructed otherwise.  Wear an absorbent pad or soft cotton gauze in your underwear as needed to catch any drainage and help keep the area  c. Keep the area clean and dry.  Bathe / shower every day.  Keep the area clean by showering / bathing over the incision / wound.   It is okay to soak an open wound to help wash it.  Wet wipes or showers / gentle washing after bowel movements is often less traumatic than regular toilet paper. d. Dennis Bast may have some styrofoam-like soft packing in the rectum which will come out with the first bowel movement.  e. You will often notice bleeding with bowel movements.  This should slow down by the end of the first week of surgery f. Expect some drainage.  This should slow down, too, by the end of the first week of surgery.  Wear an absorbent pad or soft cotton gauze in your underwear until the drainage stops. 6. ACTIVITIES as tolerated:   a. You may resume regular (light) daily activities beginning the next day-such as daily self-care, walking, climbing stairs-gradually increasing activities as tolerated.  If you can walk 30 minutes without difficulty, it is safe to try more intense activity such as jogging, treadmill, bicycling, low-impact aerobics, swimming, etc. b. Save the most intensive and strenuous activity for last such as sit-ups, heavy lifting, contact sports, etc  Refrain from any heavy lifting or straining until you are off narcotics for pain  control.   c. DO NOT PUSH THROUGH PAIN.  Let pain be your guide: If it hurts to do something, don't do it.  Pain is your body warning you to avoid that activity for another week until the pain goes down. d. You may drive when you are no longer taking prescription pain medication, you can comfortably sit for long periods of time, and you can safely maneuver your car and apply brakes. e. Dennis Bast may have sexual intercourse when it is comfortable.  7. FOLLOW UP in our office a. Please call [facility] at [phone]  to set up an appointment to see your surgeon in the office for a follow-up appointment approximately 2 weeks after your surgery. b. Make sure that you call for this appointment the day you arrive home to insure a convenient appointment time. 10. IF YOU HAVE DISABILITY OR FAMILY LEAVE FORMS, BRING THEM TO THE OFFICE FOR PROCESSING.  DO NOT GIVE THEM TO YOUR DOCTOR.        WHEN TO CALL us [phone]: 1. Poor pain control 2. Reactions / problems with new medications (rash/itching, nausea, etc)  3. Fever over 101.5 F (38.5 C) 4. Inability to urinate 5. Nausea and/or vomiting 6. Worsening swelling or bruising 7. Continued bleeding from incision. 8. Increased pain, redness, or drainage from the incision  The clinic staff is available to answer your questions during regular business hours (8:30am-5pm).  Please don't hesitate to call and ask to speak to one of our nurses for clinical concerns.   A surgeon from [facility] is always on call at the hospitals   If you have a medical emergency, go to the nearest emergency room or call 911.    [facility], [place], [place], [place],   [address] ? MAIN: [phone] ? TOLL FREE: [phone] ? FAX [phone] [email or web address]  GETTING TO GOOD BOWEL HEALTH. Irregular bowel habits such as constipation and diarrhea can lead to many problems over time.  Having one soft bowel  movement a day is the most important way to prevent further problems.  The anorectal canal is designed to handle stretching and feces to safely manage our ability to get rid of solid waste (feces, poop, stool) out of our body.  BUT, hard constipated stools can act like ripping concrete bricks and diarrhea can be a burning fire to this very sensitive area of our body, causing inflamed hemorrhoids, anal fissures, increasing risk is perirectal abscesses, abdominal pain/bloating, an making irritable bowel worse.     The goal: ONE SOFT BOWEL MOVEMENT A DAY!  To have soft, regular bowel movements:    Drink at least 8 tall glasses of water a day.     Take plenty of fiber.  Fiber is the undigested part of plant food that passes into the colon, acting s "natures broom" to encourage bowel motility and movement.  Fiber can absorb and hold large amounts of water. This results in a larger, bulkier stool, which is soft and easier to pass. Work gradually over several weeks up to 6 servings a day of fiber (25g a day even more if needed) in the form of: o Vegetables -- Root (potatoes, carrots, turnips), leafy green (lettuce, salad greens, celery, spinach), or cooked high residue (cabbage, broccoli, etc) o Fruit -- Fresh (unpeeled skin & pulp), Dried (prunes, apricots, cherries, etc ),  or stewed ( applesauce)  o Whole grain breads, pasta, etc (whole wheat)  o Bran cereals    Bulking Agents -- This type of water-retaining fiber generally is easily obtained each day by one of the following:  o Psyllium bran -- The psyllium plant is remarkable because its ground seeds can retain so much water. This product is available as Metamucil, Konsyl, Effersyllium, Per Diem Fiber, or the less expensive generic preparation in drug and health food stores. Although labeled a laxative, it really is not a laxative.  o Methylcellulose -- This is another fiber derived from wood which also retains water. It is available as  Citrucel. o Polyethylene Glycol - and "artificial"  fiber commonly called Miralax or Glycolax.  It is helpful for people with gassy or bloated feelings with regular fiber o Flax Seed - a less gassy fiber than psyllium   No reading or other relaxing activity while on the toilet. If bowel movements take longer than 5 minutes, you are too constipated   AVOID CONSTIPATION.  High fiber and water intake usually takes care of this.  Sometimes a laxative is needed to stimulate more frequent bowel movements, but    Laxatives are not a good long-term solution as it can wear the colon out. o Osmotics (Milk of Magnesia, Fleets phosphosoda, Magnesium citrate, MiraLax, GoLytely) are safer than  o Stimulants (Senokot, Castor Oil, Dulcolax, Ex Lax)    o Do not take laxatives for more than 7days in a row.    IF SEVERELY CONSTIPATED, try a Bowel Retraining Program: o Do not use laxatives.  o Eat a diet high in roughage, such as bran cereals and leafy vegetables.  o Drink six (6) ounces of prune or apricot juice each morning.  o Eat two (2) large servings of stewed fruit each day.  o Take one (1) heaping tablespoon of a psyllium-based bulking agent twice a day. Use sugar-free sweetener when possible to avoid excessive calories.  o Eat a normal breakfast.  o Set aside 15 minutes after breakfast to sit on the toilet, but do not strain to have a bowel movement.  o If you do not have a bowel movement by the third day, use an enema and repeat the above steps.    Controlling diarrhea o Switch to liquids and simpler foods for a few days to avoid stressing your intestines further. o Avoid dairy products (especially milk & ice cream) for a short time.  The intestines often can lose the ability to digest lactose when stressed. o Avoid foods that cause gassiness or bloating.  Typical foods include beans and other legumes, cabbage, broccoli, and dairy foods.  Every person has some sensitivity to other foods, so listen to our  body and avoid those foods that trigger problems for you. o Adding fiber (Citrucel, Metamucil, psyllium, Miralax) gradually can help thicken stools by absorbing excess fluid and retrain the intestines to act more normally.  Slowly increase the dose over a few weeks.  Too much fiber too soon can backfire and cause cramping & bloating. o Probiotics (such as active yogurt, Align, etc) may help repopulate the intestines and colon with normal bacteria and calm down a sensitive digestive tract.  Most studies show it to be of mild help, though, and such products can be costly. o Medicines:   Bismuth subsalicylate (ex. Kayopectate, Pepto Bismol) every 30 minutes for up to 6 doses can help control diarrhea.  Avoid if pregnant.   Loperamide (Immodium) can slow down diarrhea.  Start with two tablets (4mg  total) first and then try one tablet every 6 hours.  Avoid if you are having fevers or severe pain.  If you are not better or start feeling worse, stop all medicines and call your doctor for advice o Call your doctor if you are getting worse or not better.  Sometimes further testing (cultures, endoscopy, X-ray studies, bloodwork, etc) may be needed to help diagnose and treat the cause of the diarrhea. o

## 2013-11-29 ENCOUNTER — Encounter (INDEPENDENT_AMBULATORY_CARE_PROVIDER_SITE_OTHER): Payer: Self-pay

## 2013-11-29 ENCOUNTER — Telehealth (INDEPENDENT_AMBULATORY_CARE_PROVIDER_SITE_OTHER): Payer: Self-pay

## 2013-11-29 NOTE — Telephone Encounter (Signed)
Called pt to notify him that I did fax the RTW note to [phone] per the pt's request to his job. Confirmation of the fax going thru was received. The pt understands.

## 2013-12-04 ENCOUNTER — Telehealth: Payer: Self-pay | Admitting: Nurse Practitioner

## 2013-12-04 NOTE — Telephone Encounter (Signed)
Patient aware, no more samples available.

## 2013-12-05 ENCOUNTER — Other Ambulatory Visit: Payer: Self-pay | Admitting: *Deleted

## 2013-12-05 MED ORDER — BENAZEPRIL-HYDROCHLOROTHIAZIDE 10-12.5 MG PO TABS: ORAL_TABLET | ORAL | Status: DC

## 2013-12-06 ENCOUNTER — Other Ambulatory Visit: Payer: Self-pay | Admitting: *Deleted

## 2013-12-06 MED ORDER — AMLODIPINE BESYLATE 5 MG PO TABS: ORAL_TABLET | ORAL | Status: DC

## 2013-12-13 ENCOUNTER — Other Ambulatory Visit: Payer: Self-pay | Admitting: *Deleted

## 2013-12-13 DIAGNOSIS — E785 Hyperlipidemia, unspecified: Secondary | ICD-10-CM

## 2013-12-13 MED ORDER — ROSUVASTATIN CALCIUM 10 MG PO TABS: 10.0000 mg | ORAL_TABLET | Freq: Every day | ORAL | Status: DC

## 2013-12-13 NOTE — Telephone Encounter (Signed)
Patient aware we are out and is going to pick up coupon card

## 2013-12-14 ENCOUNTER — Other Ambulatory Visit (INDEPENDENT_AMBULATORY_CARE_PROVIDER_SITE_OTHER): Payer: Self-pay

## 2013-12-14 DIAGNOSIS — A630 Anogenital (venereal) warts: Secondary | ICD-10-CM

## 2013-12-14 MED ORDER — OXYCODONE-ACETAMINOPHEN 5-325 MG PO TABS: 1.0000 | ORAL_TABLET | Freq: Four times a day (QID) | ORAL | Status: DC | PRN

## 2013-12-24 ENCOUNTER — Other Ambulatory Visit (INDEPENDENT_AMBULATORY_CARE_PROVIDER_SITE_OTHER): Payer: Self-pay

## 2013-12-24 ENCOUNTER — Telehealth (INDEPENDENT_AMBULATORY_CARE_PROVIDER_SITE_OTHER): Payer: Self-pay

## 2013-12-24 MED ORDER — OXYCODONE-ACETAMINOPHEN 5-325 MG PO TABS: 1.0000 | ORAL_TABLET | Freq: Four times a day (QID) | ORAL | Status: DC | PRN

## 2013-12-24 NOTE — Telephone Encounter (Signed)
I am okay with renewing narcotics the first 60 days if any operation, especially perianal cases.  Oxycodone #40

## 2013-12-24 NOTE — Telephone Encounter (Signed)
Informed pt that Dr Johney Maine refilled his pain medication and it will be ready for him to pick up at the front desk. Pt verbalized understanding.

## 2013-12-24 NOTE — Telephone Encounter (Signed)
Pt s/p perianal masses consistent with condylomata. Pt is calling today to get a refill on his Oxycodone 5/325mg . Pt rates his pain a 5 at this time. Informed pt that I would send Dr Johney Maine a message. Informed pt that if med is refilled he will need to come by the office to pick up the Rx. Pt verbalized understanding and agrees with POC.

## 2014-01-01 ENCOUNTER — Ambulatory Visit (INDEPENDENT_AMBULATORY_CARE_PROVIDER_SITE_OTHER): Payer: PRIVATE HEALTH INSURANCE | Admitting: Surgery

## 2014-01-01 ENCOUNTER — Encounter (INDEPENDENT_AMBULATORY_CARE_PROVIDER_SITE_OTHER): Payer: Self-pay

## 2014-01-01 ENCOUNTER — Encounter (INDEPENDENT_AMBULATORY_CARE_PROVIDER_SITE_OTHER): Payer: Self-pay | Admitting: Surgery

## 2014-01-01 VITALS — BP 126/74 | HR 80 | Temp 97.0°F | Ht 72.0 in | Wt 250.0 lb

## 2014-01-01 DIAGNOSIS — K648 Other hemorrhoids: Secondary | ICD-10-CM

## 2014-01-01 DIAGNOSIS — A630 Anogenital (venereal) warts: Secondary | ICD-10-CM

## 2014-01-01 NOTE — Patient Instructions (Signed)
Recommended surgeon followup physical exam schedule for condyloma/warts: -every 6 months until negative exam x2, then -every Year until negative exam x2, then -as needed thereafter   Genital Warts Genital warts are a sexually transmitted infection. They may appear as small bumps on the tissues of the genital area. CAUSES  Genital warts are caused by a virus called human papillomavirus (HPV). HPV is the most common sexually transmitted disease (STD) and infection of the sex organs. This infection is spread by having unprotected sex with an infected person. It can be spread by vaginal, anal, and oral sex. Many people do not know they are infected. They may be infected for years without problems. However, even if they do not have problems, they can unknowingly pass the infection to their sexual partners. SYMPTOMS   Itching and irritation in the genital area.Anal wart   Warts that bleed.  Painful sexual intercourse. DIAGNOSIS  Warts are usually recognized with the naked eye on the vagina, vulva, perineum, anus, and rectum. Certain tests can also diagnose genital warts, such as:  A Pap test.  A tissue sample (biopsy) exam.  Colposcopy. A magnifying tool is used to examine the vagina and cervix. The HPV cells will change color when certain solutions are used. TREATMENT  Warts can be removed by:  Applying certain chemicals, such as cantharidin or podophyllin.  Liquid nitrogen freezing (cryotherapy).  Immunotherapy with candida or trichophyton injections.  Laser treatment.  Burning with an electrified probe (electrocautery).  Interferon injections.  Surgery. PREVENTION  HPV vaccination can help prevent HPV infections that cause genital warts and that cause cancer of the cervix. It is recommended that the vaccination be given to people between the ages 32 to 2 years old. The vaccine might not work as well or might not work at all if you already have HPV. It should not be given to  pregnant women. HOME CARE INSTRUCTIONS   It is important to follow your caregiver's instructions. The warts will not go away without treatment. Repeat treatments are often needed to get rid of warts. Even after it appears that the warts are gone, the normal tissue underneath often remains infected.  Do not try to treat genital warts with medicine used to treat hand warts. This type of medicine is strong and can burn the skin in the genital area, causing more damage.  Tell your past and current sexual partner(s) that you have genital warts. They may be infected also and need treatment.  Avoid sexual contact while being treated.  Do not touch or scratch the warts. The infection may spread to other parts of your body.  Women with genital warts should have a cervical cancer check (Pap test) at least once a year. This type of cancer is slow-growing and can be cured if found early. Chances of developing cervical cancer are increased with HPV.  Inform your obstetrician about your warts in the event of pregnancy. This virus can be passed to the baby's respiratory tract. Discuss this with your caregiver.  Use a condom during sexual intercourse. Following treatment, the use of condoms will help prevent reinfection.  Ask your caregiver about using over-the-counter anti-itch creams. SEEK MEDICAL CARE IF:   Your treated skin becomes red, swollen, or painful.  You have a fever.  You feel generally ill.  You feel little lumps in and around your genital area.  You are bleeding or have painful sexual intercourse. MAKE SURE YOU:   Understand these instructions.  Will watch your condition.  Will get help right away if you are not doing well or get worse.   ANORECTAL SURGERY: POST OP INSTRUCTIONS  1. Take your usually prescribed home medications unless otherwise directed. 2. DIET: Follow a light bland diet the first 24 hours after arrival home, such as soup, liquids, crackers, etc.  Be sure to  include lots of fluids daily.  Avoid fast food or heavy meals as your are more likely to get nauseated.  Eat a low fat the next few days after surgery.   3. PAIN CONTROL: a. Pain is best controlled by a usual combination of three different methods TOGETHER: i. Ice/Heat ii. Over the counter pain medication iii. Prescription pain medication b. Most patients will experience some swelling and discomfort in the anus/rectal area. and incisions.  Ice packs or heat (30-60 minutes up to 6 times a day) will help. Use ice for the first few days to help decrease swelling and bruising, then switch to heat such as warm towels, sitz baths, warm baths, etc to help relax tight/sore spots and speed recovery.  Some people prefer to use ice alone, heat alone, alternating between ice & heat.  Experiment to what works for you.  Swelling and bruising can take several weeks to resolve.   c. It is helpful to take an over-the-counter pain medication regularly for the first few weeks.  Choose one of the following that works best for you: i. Naproxen (Aleve, etc)  Two 220mg  tabs twice a day ii. Ibuprofen (Advil, etc) Three 200mg  tabs four times a day (every meal & bedtime) iii. Acetaminophen (Tylenol, etc) 500-650mg  four times a day (every meal & bedtime) d. A  prescription for pain medication (such as oxycodone, hydrocodone, etc) should be given to you upon discharge.  Take your pain medication as prescribed.  i. If you are having problems/concerns with the prescription medicine (does not control pain, nausea, vomiting, rash, itching, etc), please call us [phone] to see if we need to switch you to a different pain medicine that will work better for you and/or control your side effect better. ii. If you need a refill on your pain medication, please contact your pharmacy.  They will contact our office to request authorization. Prescriptions will not be filled after 5 pm or on week-ends.  Use a Sitz Bath 4-8 times a day for  relief A sitz bath is a warm water bath taken in the sitting position that covers only the hips and buttocks. It may be used for either healing or hygiene purposes. Sitz baths are also used to relieve pain, itching, or muscle spasms. The water may contain medicine. Moist heat will help you heal and relax.  HOME CARE INSTRUCTIONS  Take 3 to 4 sitz baths a day. 1. Fill the bathtub half full with warm water. 2. Sit in the water and open the drain a little. 3. Turn on the warm water to keep the tub half full. Keep the water running constantly. 4. Soak in the water for 15 to 20 minutes. 5. After the sitz bath, pat the affected area dry first. SEEK MEDICAL CARE IF:  You get worse instead of better. Stop the sitz baths if you get worse.   4. KEEP YOUR BOWELS REGULAR a. The goal is one bowel movement a day b. Avoid getting constipated.  Between the surgery and the pain medications, it is common to experience some constipation.  Increasing fluid intake and taking a fiber supplement (such as Metamucil, Citrucel, FiberCon, MiraLax,  etc) 1-2 times a day regularly will usually help prevent this problem from occurring.  A mild laxative (prune juice, Milk of Magnesia, MiraLax, etc) should be taken according to package directions if there are no bowel movements after 48 hours. c. Watch out for diarrhea.  If you have many loose bowel movements, simplify your diet to bland foods & liquids for a few days.  Stop any stool softeners and decrease your fiber supplement.  Switching to mild anti-diarrheal medications (Kayopectate, Pepto Bismol) can help.  If this worsens or does not improve, please call us.  5. Wound Care a. Remove your bandages the day after surgery.  Unless discharge instructions indicate otherwise, leave your bandage dry and in place overnight.  Remove the bandage during your first bowel movement.   b. Allow the wound packing to fall out over the next few days.  You can trim exposed gauze / ribbon as  it falls out.  You do not need to repack the wound unless instructed otherwise.  Wear an absorbent pad or soft cotton gauze in your underwear as needed to catch any drainage and help keep the area  c. Keep the area clean and dry.  Bathe / shower every day.  Keep the area clean by showering / bathing over the incision / wound.   It is okay to soak an open wound to help wash it.  Wet wipes or showers / gentle washing after bowel movements is often less traumatic than regular toilet paper. d. Dennis Bast may have some styrofoam-like soft packing in the rectum which will come out with the first bowel movement.  e. You will often notice bleeding with bowel movements.  This should slow down by the end of the first week of surgery f. Expect some drainage.  This should slow down, too, by the end of the first week of surgery.  Wear an absorbent pad or soft cotton gauze in your underwear until the drainage stops. 6. ACTIVITIES as tolerated:   a. You may resume regular (light) daily activities beginning the next day-such as daily self-care, walking, climbing stairs-gradually increasing activities as tolerated.  If you can walk 30 minutes without difficulty, it is safe to try more intense activity such as jogging, treadmill, bicycling, low-impact aerobics, swimming, etc. b. Save the most intensive and strenuous activity for last such as sit-ups, heavy lifting, contact sports, etc  Refrain from any heavy lifting or straining until you are off narcotics for pain control.   c. DO NOT PUSH THROUGH PAIN.  Let pain be your guide: If it hurts to do something, don't do it.  Pain is your body warning you to avoid that activity for another week until the pain goes down. d. You may drive when you are no longer taking prescription pain medication, you can comfortably sit for long periods of time, and you can safely maneuver your car and apply brakes. e. Dennis Bast may have sexual intercourse when it is comfortable.  7. FOLLOW UP in our  office a. Please call [facility] at [phone] to set up an appointment to see your surgeon in the office for a follow-up appointment approximately 2 weeks after your surgery. b. Make sure that you call for this appointment the day you arrive home to insure a convenient appointment time. 10. IF YOU HAVE DISABILITY OR FAMILY LEAVE FORMS, BRING THEM TO THE OFFICE FOR PROCESSING.  DO NOT GIVE THEM TO YOUR DOCTOR.        WHEN TO CALL us (  [phone]: 1. Poor pain control 2. Reactions / problems with new medications (rash/itching, nausea, etc)  3. Fever over 101.5 F (38.5 C) 4. Inability to urinate 5. Nausea and/or vomiting 6. Worsening swelling or bruising 7. Continued bleeding from incision. 8. Increased pain, redness, or drainage from the incision  The clinic staff is available to answer your questions during regular business hours (8:30am-5pm).  Please don't hesitate to call and ask to speak to one of our nurses for clinical concerns.   A surgeon from [facility] is always on call at the hospitals   If you have a medical emergency, go to the nearest emergency room or call 911.    [facility], [place], [place], [place], [place]  [address] ? MAIN: [phone] ? TOLL FREE: [phone] ? FAX [phone] [email or web address]

## 2014-01-01 NOTE — Progress Notes (Signed)
Subjective:     Patient ID: Logan Smith, male   DOB: Dec 16, 1970, 43 y.o.   MRN: [id number]  HPI  Note: This dictation was prepared with Dragon/digital dictation along with [facility] technology. Any transcriptional errors that result from this process are unintentional.       Manfred Laspina  12/08/1970 [id number]  Patient Care Team: Chipper Herb, MD as PCP - General (Family Medicine) Mary-Margaret Hassell Done, FNP as Nurse Practitioner (Nurse Practitioner)  Procedure (Date: 12/14/2013):  Laser ablation of perianal condylomata  This patient returns for surgical re-evaluation.  He is feeling pretty good overall.  Denies any fevers or chills.  Soreness going down.  Energy level contact.  Hoping to get back to work soon.  Patient Active Problem List   Diagnosis Date Noted  . 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 Hemorrhoids, internal 11/28/2013  . Hypertension 01/05/2013  . Hyperlipemia 01/05/2013  . ED (erectile dysfunction) 01/05/2013    Past Medical History  Diagnosis Date  . Hyperlipidemia   . Hypertension   . Erectile dysfunction     No past surgical history on file.  History   Social History  . Marital Status: Single    Spouse Name: N/A    Number of Children: N/A  . Years of Education: N/A   Occupational History  . Not on file.   Social History Main Topics  . Smoking status: Never Smoker   . Smokeless tobacco: Not on file  . Alcohol Use: No     Comment: rarely  . Drug Use: No  . Sexual Activity: Not on file   Other Topics Concern  . Not on file   Social History Narrative  . No narrative on file    Family History  Problem Relation Age of Onset  . Diabetes Mother   . Hypertension Mother   . Hypertension Father     Current Outpatient Prescriptions  Medication Sig Dispense Refill  . amLODipine (NORVASC) 5 MG tablet TAKE 1 TABLET EVERY DAY  30 tablet  1  . benazepril-hydrochlorthiazide (LOTENSIN HCT) 10-12.5 MG per tablet  TAKE 1 TABLET DAILY  30 tablet  1  . fish oil-omega-3 fatty acids 1000 MG capsule Take 2 g by mouth daily.      . hydrocortisone (PROCTOSOL HC) 2.5 % rectal cream PLACE RECTALLY 2 (TWO) TIMES DAILY.  28.35 g  1  . meloxicam (MOBIC) 15 MG tablet Take 1 tablet (15 mg total) by mouth daily.  30 tablet  1  . Multiple Vitamins-Minerals (MENS 50+ MULTI VITAMIN/MIN PO) Take by mouth.      . oxyCODONE-acetaminophen (ROXICET) 5-325 MG per tablet Take 1 tablet by mouth every 6 (six) hours as needed.  40 tablet  0  . rosuvastatin (CRESTOR) 10 MG tablet Take 1 tablet (10 mg total) by mouth daily.  30 tablet  0  . rosuvastatin (CRESTOR) 10 MG tablet Take 1 tablet (10 mg total) by mouth daily.  90 tablet  0  . sildenafil (VIAGRA) 100 MG tablet Take 100 mg by mouth daily as needed for erectile dysfunction.       No current facility-administered medications for this visit.     No Known Allergies  BP 126/74  Pulse 80  Temp(Src) 97 F (36.1 C)  Ht 6' (1.829 m)  Wt 250 lb (113.399 kg)  BMI 33.90 kg/m2  No results found.   Review of Systems  Constitutional: Negative for fever, chills and diaphoresis.  HENT: Negative for sore throat  and trouble swallowing.   Eyes: Negative for photophobia and visual disturbance.  Respiratory: Negative for choking and shortness of breath.   Cardiovascular: Negative for chest pain and palpitations.  Gastrointestinal: Positive for rectal pain. Negative for nausea, vomiting, abdominal distention and anal bleeding.  Genitourinary: Negative for dysuria, urgency, difficulty urinating and testicular pain.  Musculoskeletal: Negative for arthralgias, gait problem, myalgias and neck pain.  Skin: Positive for wound. Negative for color change and rash.  Neurological: Negative for dizziness, speech difficulty, weakness and numbness.  Hematological: Negative for adenopathy.  Psychiatric/Behavioral: Negative for hallucinations, confusion and agitation.       Objective:    Physical Exam  Constitutional: He is oriented to person, place, and time. He appears well-developed and well-nourished. No distress.  HENT:  Head: Normocephalic.  Mouth/Throat: Oropharynx is clear and moist. No oropharyngeal exudate.  Eyes: Conjunctivae and EOM are normal. Pupils are equal, round, and reactive to light. No scleral icterus.  Neck: Normal range of motion. No tracheal deviation present.  Cardiovascular: Normal rate, normal heart sounds and intact distal pulses.   Pulmonary/Chest: Effort normal. No respiratory distress.  Abdominal: Soft. He exhibits no distension. There is no tenderness. Hernia confirmed negative in the right inguinal area and confirmed negative in the left inguinal area.  Incisions clean with normal healing ridges.  No hernias  Genitourinary:     Musculoskeletal: Normal range of motion. He exhibits no tenderness.  Neurological: He is alert and oriented to person, place, and time. No cranial nerve deficit. He exhibits normal muscle tone. Coordination normal.  Skin: Skin is warm and dry. No rash noted. He is not diaphoretic.  Psychiatric: He has a normal mood and affect. His behavior is normal.       Assessment:     Improving status post laser ablation of perianal condylomata.     Plan:     Increase activity as tolerated to regular activity.  Low impact exercise such as walking an hour a day at least ideal.  Do not push through pain.  Diet as tolerated.  Low fat high fiber diet ideal.  Bowel regimen with 30 g fiber a day and fiber supplement as needed to avoid problems.  Return to clinic 1 month, sooner as needed.   Instructions discussed.  Followup with primary care physician for other health issues as would normally be done.  Consider screening for malignancies (breast, prostate, colon, melanoma, etc) as appropriate.  Surgical (Rectal exam) follow-up after resection of condyloma acuminatum (warts): -every 6 months until negative exam x2, then -every  Year until negative exam x2, then -as needed thereafter   Questions answered.  The patient expressed understanding and appreciation

## 2014-01-04 ENCOUNTER — Other Ambulatory Visit: Payer: Self-pay | Admitting: Nurse Practitioner

## 2014-01-18 ENCOUNTER — Telehealth (INDEPENDENT_AMBULATORY_CARE_PROVIDER_SITE_OTHER): Payer: Self-pay

## 2014-01-18 NOTE — Telephone Encounter (Signed)
Pt s/p perianal masses consistent with condylomata. Pt states that he has still been having some itching and has started noticed a small amt of blood. Advised the patient to continue to use baby wipes after each BM. Advised the patient that it takes time for those symptoms to go away. Pt denies any fevers, chills, n/v or pain. Pt has f/u appt for 7/23. Pt verbalized understanding and agrees with POC.

## 2014-02-04 ENCOUNTER — Ambulatory Visit (INDEPENDENT_AMBULATORY_CARE_PROVIDER_SITE_OTHER): Payer: PRIVATE HEALTH INSURANCE | Admitting: Surgery

## 2014-02-04 ENCOUNTER — Encounter (INDEPENDENT_AMBULATORY_CARE_PROVIDER_SITE_OTHER): Payer: Self-pay | Admitting: Surgery

## 2014-02-04 VITALS — BP 122/78 | HR 80 | Resp 18 | Ht 72.0 in | Wt 253.0 lb

## 2014-02-04 DIAGNOSIS — A630 Anogenital (venereal) warts: Secondary | ICD-10-CM

## 2014-02-04 DIAGNOSIS — K648 Other hemorrhoids: Secondary | ICD-10-CM

## 2014-02-04 NOTE — Patient Instructions (Signed)
Recommended surgeon followup physical exam schedule for condyloma/warts: -every 6 months until negative exam x2, then -every Year until negative exam x2, then -as needed thereafter   Genital Warts Genital warts are a sexually transmitted infection. They may appear as small bumps on the tissues of the genital area. CAUSES  Genital warts are caused by a virus called human papillomavirus (HPV). HPV is the most common sexually transmitted disease (STD) and infection of the sex organs. This infection is spread by having unprotected sex with an infected person. It can be spread by vaginal, anal, and oral sex. Many people do not know they are infected. They may be infected for years without problems. However, even if they do not have problems, they can unknowingly pass the infection to their sexual partners. SYMPTOMS   Itching and irritation in the genital area.Anal wart   Warts that bleed.  Painful sexual intercourse. DIAGNOSIS  Warts are usually recognized with the naked eye on the vagina, vulva, perineum, anus, and rectum. Certain tests can also diagnose genital warts, such as:  A Pap test.  A tissue sample (biopsy) exam.  Colposcopy. A magnifying tool is used to examine the vagina and cervix. The HPV cells will change color when certain solutions are used. TREATMENT  Warts can be removed by:  Applying certain chemicals, such as cantharidin or podophyllin.  Liquid nitrogen freezing (cryotherapy).  Immunotherapy with candida or trichophyton injections.  Laser treatment.  Burning with an electrified probe (electrocautery).  Interferon injections.  Surgery. PREVENTION  HPV vaccination can help prevent HPV infections that cause genital warts and that cause cancer of the cervix. It is recommended that the vaccination be given to people between the ages 2 to 49 years old. The vaccine might not work as well or might not work at all if you already have HPV. It should not be given to  pregnant women. HOME CARE INSTRUCTIONS   It is important to follow your caregiver's instructions. The warts will not go away without treatment. Repeat treatments are often needed to get rid of warts. Even after it appears that the warts are gone, the normal tissue underneath often remains infected.  Do not try to treat genital warts with medicine used to treat hand warts. This type of medicine is strong and can burn the skin in the genital area, causing more damage.  Tell your past and current sexual partner(s) that you have genital warts. They may be infected also and need treatment.  Avoid sexual contact while being treated.  Do not touch or scratch the warts. The infection may spread to other parts of your body.  Women with genital warts should have a cervical cancer check (Pap test) at least once a year. This type of cancer is slow-growing and can be cured if found early. Chances of developing cervical cancer are increased with HPV.  Inform your obstetrician about your warts in the event of pregnancy. This virus can be passed to the baby's respiratory tract. Discuss this with your caregiver.  Use a condom during sexual intercourse. Following treatment, the use of condoms will help prevent reinfection.  Ask your caregiver about using over-the-counter anti-itch creams. SEEK MEDICAL CARE IF:   Your treated skin becomes red, swollen, or painful.  You have a fever.  You feel generally ill.  You feel little lumps in and around your genital area.  You are bleeding or have painful sexual intercourse. MAKE SURE YOU:   Understand these instructions.  Will watch your condition.  Will get help right away if you are not doing well or get worse.

## 2014-02-04 NOTE — Progress Notes (Signed)
Subjective:     Patient ID: Logan Smith, male   DOB: 11/18/1970, 43 y.o.   MRN: [id number]  HPI   Note: This dictation was prepared with Dragon/digital dictation along with [facility] technology. Any transcriptional errors that result from this process are unintentional.       Jerzy Roepke  [birth year] [id number]  Patient Care Team: Chipper Herb, MD as PCP - General (Family Medicine) Mary-Margaret Hassell Done, FNP as Nurse Practitioner (Nurse Practitioner)  Procedure (Date: 12/14/2013):  Laser ablation of perianal condylomata  This patient returns for surgical re-evaluation.  He is feeling pretty good overall.  Denies any fevers or chills.  Soreness gone.  Very mild intermittent itching at times.  Much improved.  Energy level normal.    Patient Active Problem List   Diagnosis Date Noted  . 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 Hemorrhoids, internal 11/28/2013  . Hypertension 01/05/2013  . Hyperlipemia 01/05/2013  . ED (erectile dysfunction) 01/05/2013    Past Medical History  Diagnosis Date  . Hyperlipidemia   . Hypertension   . Erectile dysfunction     History reviewed. No pertinent past surgical history.  History   Social History  . Marital Status: Single    Spouse Name: N/A    Number of Children: N/A  . Years of Education: N/A   Occupational History  . Not on file.   Social History Main Topics  . Smoking status: Never Smoker   . Smokeless tobacco: Not on file  . Alcohol Use: No     Comment: rarely  . Drug Use: No  . Sexual Activity: Not on file   Other Topics Concern  . Not on file   Social History Narrative  . No narrative on file    Family History  Problem Relation Age of Onset  . Diabetes Mother   . Hypertension Mother   . Hypertension Father     Current Outpatient Prescriptions  Medication Sig Dispense Refill  . amLODipine (NORVASC) 5 MG tablet TAKE 1 TABLET EVERY DAY  30 tablet  1  . benazepril-hydrochlorthiazide  (LOTENSIN HCT) 10-12.5 MG per tablet TAKE 1 TABLET DAILY  30 tablet  1  . fish oil-omega-3 fatty acids 1000 MG capsule Take 2 g by mouth daily.      . meloxicam (MOBIC) 15 MG tablet TAKE 1 TABLET (15 MG TOTAL) BY MOUTH DAILY.  30 tablet  1  . Multiple Vitamins-Minerals (MENS 50+ MULTI VITAMIN/MIN PO) Take by mouth.      . rosuvastatin (CRESTOR) 10 MG tablet Take 1 tablet (10 mg total) by mouth daily.  30 tablet  0  . sildenafil (VIAGRA) 100 MG tablet Take 100 mg by mouth daily as needed for erectile dysfunction.      . hydrocortisone (PROCTOSOL HC) 2.5 % rectal cream PLACE RECTALLY 2 (TWO) TIMES DAILY.  28.35 g  1   No current facility-administered medications for this visit.     No Known Allergies  BP 122/78  Pulse 80  Resp 18  Ht 6' (1.829 m)  Wt 253 lb (114.76 kg)  BMI 34.31 kg/m2  No results found.   Review of Systems  Constitutional: Negative for fever, chills and diaphoresis.  HENT: Negative for sore throat and trouble swallowing.   Eyes: Negative for photophobia and visual disturbance.  Respiratory: Negative for choking and shortness of breath.   Cardiovascular: Negative for chest pain and palpitations.  Gastrointestinal: Positive for rectal pain. Negative for nausea, vomiting, abdominal distention and  anal bleeding.  Genitourinary: Negative for dysuria, urgency, difficulty urinating and testicular pain.  Musculoskeletal: Negative for arthralgias, gait problem, myalgias and neck pain.  Skin: Positive for wound. Negative for color change and rash.  Neurological: Negative for dizziness, speech difficulty, weakness and numbness.  Hematological: Negative for adenopathy.  Psychiatric/Behavioral: Negative for hallucinations, confusion and agitation.       Objective:   Physical Exam  Constitutional: He is oriented to person, place, and time. He appears well-developed and well-nourished. No distress.  HENT:  Head: Normocephalic.  Mouth/Throat: Oropharynx is clear and moist.  No oropharyngeal exudate.  Eyes: Conjunctivae and EOM are normal. Pupils are equal, round, and reactive to light. No scleral icterus.  Neck: Normal range of motion. No tracheal deviation present.  Cardiovascular: Normal rate, normal heart sounds and intact distal pulses.   Pulmonary/Chest: Effort normal. No respiratory distress.  Abdominal: Soft. He exhibits no distension. There is no tenderness. Hernia confirmed negative in the right inguinal area and confirmed negative in the left inguinal area.  Genitourinary:     Musculoskeletal: Normal range of motion. He exhibits no tenderness.  Neurological: He is alert and oriented to person, place, and time. No cranial nerve deficit. He exhibits normal muscle tone. Coordination normal.  Skin: Skin is warm and dry. No rash noted. He is not diaphoretic.  Psychiatric: He has a normal mood and affect. His behavior is normal.       Assessment:     Improving 6 weeks status post laser ablation of perianal condylomata.     Plan:     Increase activity as tolerated to regular activity.  Low impact exercise such as walking an hour a day at least ideal.  Do not push through pain.  Diet as tolerated.  Low fat high fiber diet ideal.  Bowel regimen with 30 g fiber a day and fiber supplement as needed to avoid problems.  Return to clinic 3 month for follow up, sooner as needed.  Surgical (Rectal exam) follow-up after resection of condyloma acuminatum (warts): -every 6 months until negative exam x2, then -every Year until negative exam x2, then -as needed thereafter    Instructions discussed.  Followup with primary care physician for other health issues as would normally be done.  Consider screening for malignancies (breast, prostate, colon, melanoma, etc) as appropriate. Questions answered.  The patient expressed understanding and appreciation

## 2014-02-07 ENCOUNTER — Other Ambulatory Visit: Payer: Self-pay | Admitting: *Deleted

## 2014-02-07 MED ORDER — AMLODIPINE BESYLATE 5 MG PO TABS: ORAL_TABLET | ORAL | Status: DC

## 2014-02-07 MED ORDER — BENAZEPRIL-HYDROCHLOROTHIAZIDE 10-12.5 MG PO TABS: ORAL_TABLET | ORAL | Status: DC

## 2014-02-19 ENCOUNTER — Telehealth (INDEPENDENT_AMBULATORY_CARE_PROVIDER_SITE_OTHER): Payer: Self-pay

## 2014-02-19 NOTE — Telephone Encounter (Signed)
Pt called back stating he still has itching with BM. He was told at last office visit he could expect this for some time. He is scheduled to come back in Oct and will address this with Dr Johney Maine at that time. He will give it more time to get better.

## 2014-02-20 ENCOUNTER — Ambulatory Visit: Payer: BC Managed Care – PPO | Admitting: Nurse Practitioner

## 2014-02-25 ENCOUNTER — Encounter: Payer: Self-pay | Admitting: Family Medicine

## 2014-02-25 ENCOUNTER — Ambulatory Visit (INDEPENDENT_AMBULATORY_CARE_PROVIDER_SITE_OTHER): Payer: BC Managed Care – PPO | Admitting: Family Medicine

## 2014-02-25 VITALS — BP 126/84 | HR 90 | Temp 97.5°F | Ht 72.0 in | Wt 248.6 lb

## 2014-02-25 DIAGNOSIS — R7309 Other abnormal glucose: Secondary | ICD-10-CM

## 2014-02-25 DIAGNOSIS — I10 Essential (primary) hypertension: Secondary | ICD-10-CM

## 2014-02-25 DIAGNOSIS — E785 Hyperlipidemia, unspecified: Secondary | ICD-10-CM

## 2014-02-25 DIAGNOSIS — R7303 Prediabetes: Secondary | ICD-10-CM

## 2014-02-25 LAB — POCT GLYCOSYLATED HEMOGLOBIN (HGB A1C): Hemoglobin A1C: 5.1

## 2014-02-25 NOTE — Patient Instructions (Signed)

## 2014-02-25 NOTE — Progress Notes (Signed)
   Subjective:    Patient ID: Logan Smith, male    DOB: 1971/02/01, 43 y.o.   MRN: [id number]  HPI  43 year old gentleman who is here to followup hypertension and hyperlipidemia. He says that he has been treated for these problems for about 5 years. He denies any side effects or new issues with either blood pressure or lipid medicine. He states that he no longer needs Viagra. Previous labs done in January shows that he is prediabetic we discussed this at some length and given him carbohydrate counting information to take home and digest.    Review of Systems  Constitutional: Negative.   HENT: Negative.   Eyes: Negative.   Respiratory: Negative.  Negative for shortness of breath.   Cardiovascular: Negative.  Negative for chest pain and leg swelling.  Gastrointestinal: Negative.   Genitourinary: Negative.   Musculoskeletal: Negative.   Skin: Negative.   Neurological: Negative.   Psychiatric/Behavioral: Negative.   All other systems reviewed and are negative.      Objective:   Physical Exam  Constitutional: He is oriented to person, place, and time. He appears well-developed and well-nourished.  HENT:  Head: Normocephalic.  Right Ear: External ear normal.  Left Ear: External ear normal.  Nose: Nose normal.  Mouth/Throat: Oropharynx is clear and moist.  Eyes: Conjunctivae and EOM are normal. Pupils are equal, round, and reactive to light.  Neck: Normal range of motion. Neck supple.  Cardiovascular: Normal rate, regular rhythm, normal heart sounds and intact distal pulses.   Pulmonary/Chest: Effort normal and breath sounds normal.  Abdominal: Soft. Bowel sounds are normal.  Musculoskeletal: Normal range of motion.  Neurological: He is alert and oriented to person, place, and time.  Skin: Skin is warm and dry.  Psychiatric: He has a normal mood and affect. His behavior is normal. Judgment and thought content normal.   BP 126/84  Pulse 90  Temp(Src) 97.5 F (36.4 C) (Oral)   Ht 6' (1.829 m)  Wt 248 lb 9.6 oz (112.764 kg)  BMI 33.71 kg/m2        Assessment & Plan:  1. Prediabetes Given carb count instructions - POCT glycosylated hemoglobin (Hb A1C)   2. Essential hypertension Continue with same meds  3. Hyperlipemia Check lipids next visit  Wardell Honour MD

## 2014-02-27 ENCOUNTER — Telehealth: Payer: Self-pay | Admitting: Family Medicine

## 2014-02-27 NOTE — Telephone Encounter (Signed)
Message copied by Waverly Ferrari on Wed Feb 27, 2014  9:31 AM ------      Message from: Wardell Honour      Created: Wed Feb 27, 2014  8:01 AM       Hemoglobin A1c does not support a diagnosis of diabetes. This was checked because he had a slightly elevated glucose on previous labs. As with everyone. He should still maintain good weight and watch his carbohydrate intake. ------

## 2014-03-25 ENCOUNTER — Encounter: Payer: Self-pay | Admitting: *Deleted

## 2014-04-20 ENCOUNTER — Other Ambulatory Visit: Payer: Self-pay | Admitting: Nurse Practitioner

## 2014-04-23 ENCOUNTER — Other Ambulatory Visit: Payer: Self-pay | Admitting: *Deleted

## 2014-04-23 MED ORDER — ROSUVASTATIN CALCIUM 10 MG PO TABS
10.0000 mg | ORAL_TABLET | Freq: Every day | ORAL | Status: DC
Start: 2014-04-23 — End: 2014-07-17

## 2014-04-24 ENCOUNTER — Ambulatory Visit (INDEPENDENT_AMBULATORY_CARE_PROVIDER_SITE_OTHER): Payer: BC Managed Care – PPO | Admitting: Family Medicine

## 2014-04-24 ENCOUNTER — Encounter: Payer: Self-pay | Admitting: Family Medicine

## 2014-04-24 VITALS — BP 122/85 | HR 89 | Temp 98.2°F | Ht 72.0 in | Wt 254.0 lb

## 2014-04-24 DIAGNOSIS — L299 Pruritus, unspecified: Secondary | ICD-10-CM

## 2014-04-24 MED ORDER — HYDROXYZINE HCL 10 MG PO TABS: ORAL_TABLET | ORAL | Status: DC

## 2014-04-24 NOTE — Progress Notes (Signed)
   Subjective:    Patient ID: Logan Smith, male    DOB: [birth year], 43 y.o.   MRN: [id number]  HPI    Review of Systems     Objective:   Physical Exam  BP 122/85  Pulse 89  Temp(Src) 98.2 F (36.8 C) (Oral)  Ht 6' (1.829 m)  Wt 254 lb (115.214 kg)  BMI 34.44 kg/m2       Assessment & Plan:

## 2014-04-24 NOTE — Progress Notes (Signed)
   Subjective:    Patient ID: Logan Smith, male    DOB: 24-Apr-1971, 43 y.o.   MRN: [id number]  HPI 43 year old gentleman who is here with a chief complaint of. He does yardwork and has possible exposure to insects. He was seen in the emergency room about one month ago and given an unknown tablet which cured the problem. Current symptoms began about 2 days ago.    Review of Systems  Constitutional: Negative.   Skin: Positive for rash.       Objective:   Physical Exam  Skin:  Old lesions which have been scratched; no definite uricaria now          Assessment & Plan:  1. Itching Rx Atarax, 10 mg 1-2 tab tid,prn  Wardell Honour MD

## 2014-05-08 ENCOUNTER — Ambulatory Visit (INDEPENDENT_AMBULATORY_CARE_PROVIDER_SITE_OTHER): Payer: BC Managed Care – PPO | Admitting: Surgery

## 2014-07-17 ENCOUNTER — Other Ambulatory Visit: Payer: Self-pay | Admitting: Nurse Practitioner

## 2014-07-29 ENCOUNTER — Telehealth: Payer: Self-pay | Admitting: Family Medicine

## 2014-07-29 NOTE — Telephone Encounter (Signed)
Appointment given for tomorrow with [facility]

## 2014-07-30 ENCOUNTER — Encounter: Payer: Self-pay | Admitting: Family Medicine

## 2014-07-30 ENCOUNTER — Ambulatory Visit (INDEPENDENT_AMBULATORY_CARE_PROVIDER_SITE_OTHER): Payer: BLUE CROSS/BLUE SHIELD | Admitting: Family Medicine

## 2014-07-30 VITALS — BP 123/77 | HR 86 | Temp 97.3°F | Ht 72.0 in | Wt 258.0 lb

## 2014-07-30 DIAGNOSIS — B719 Cestode infection, unspecified: Secondary | ICD-10-CM

## 2014-07-30 MED ORDER — PRAZIQUANTEL 600 MG PO TABS: 600.0000 mg | ORAL_TABLET | Freq: Three times a day (TID) | ORAL | Status: DC

## 2014-07-30 NOTE — Progress Notes (Signed)
   Subjective:    Patient ID: Logan Smith, male    DOB: 05/17/1971, 44 y.o.   MRN: [id number]  HPI Patient is here c/o having tapeworm in stool.  He states he has had similar infection in past.  Review of Systems  Constitutional: Negative for fever.  HENT: Negative for ear pain.   Eyes: Negative for discharge.  Respiratory: Negative for cough.   Cardiovascular: Negative for chest pain.  Gastrointestinal: Negative for abdominal distention.  Endocrine: Negative for polyuria.  Genitourinary: Negative for difficulty urinating.  Musculoskeletal: Negative for gait problem and neck pain.  Skin: Negative for color change and rash.  Neurological: Negative for speech difficulty and headaches.  Psychiatric/Behavioral: Negative for agitation.       Objective:    BP 123/77 mmHg  Pulse 86  Temp(Src) 97.3 F (36.3 C) (Oral)  Ht 6' (1.829 m)  Wt 258 lb (117.028 kg)  BMI 34.98 kg/m2 Physical Exam  Constitutional: He is oriented to person, place, and time. He appears well-developed and well-nourished.  HENT:  Head: Normocephalic and atraumatic.  Mouth/Throat: Oropharynx is clear and moist.  Eyes: Pupils are equal, round, and reactive to light.  Neck: Normal range of motion. Neck supple.  Cardiovascular: Normal rate and regular rhythm.   No murmur heard. Pulmonary/Chest: Effort normal and breath sounds normal.  Abdominal: Soft. Bowel sounds are normal. There is no tenderness.  Neurological: He is alert and oriented to person, place, and time.  Skin: Skin is warm and dry.  Psychiatric: He has a normal mood and affect.          Assessment & Plan:     ICD-9-CM ICD-10-CM   1. Tapeworm 123.9 B71.9 praziquantel (BILTRICIDE) 600 MG tablet   He states he cannot provide stool specimen.  He asks forwork note for today.  No Follow-up on file.  Lysbeth Penner FNP

## 2014-08-27 ENCOUNTER — Ambulatory Visit: Payer: BC Managed Care – PPO | Admitting: Family Medicine

## 2014-08-29 ENCOUNTER — Encounter: Payer: Self-pay | Admitting: Family Medicine

## 2014-08-29 ENCOUNTER — Ambulatory Visit (INDEPENDENT_AMBULATORY_CARE_PROVIDER_SITE_OTHER): Payer: BLUE CROSS/BLUE SHIELD | Admitting: Family Medicine

## 2014-08-29 VITALS — BP 126/76 | HR 91 | Temp 97.8°F | Ht 72.0 in | Wt 261.0 lb

## 2014-08-29 DIAGNOSIS — I10 Essential (primary) hypertension: Secondary | ICD-10-CM | POA: Diagnosis not present

## 2014-08-29 DIAGNOSIS — E785 Hyperlipidemia, unspecified: Secondary | ICD-10-CM | POA: Diagnosis not present

## 2014-08-29 NOTE — Progress Notes (Signed)
   Subjective:    Patient ID: Logan Smith, male    DOB: 21-Aug-1970, 44 y.o.   MRN: [id number]  HPI 44 year old gentleman here to follow-up with his blood pressure and cholesterol. He denies any current problems. He was treated most recently for tapeworm and has not seen any evidence that the problem persists. He is tolerating medicine well specifically without myalgias. He denies any itching or pruritus that he suffered from back in warmer weather.  Patient Active Problem List   Diagnosis Date Noted  . 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 Hemorrhoids, internal 11/28/2013  . Hypertension 01/05/2013  . Hyperlipemia 01/05/2013  . ED (erectile dysfunction) 01/05/2013   Outpatient Encounter Prescriptions as of 08/29/2014  Medication Sig  . amLODipine (NORVASC) 5 MG tablet TAKE 1 TABLET EVERY DAY  . benazepril-hydrochlorthiazide (LOTENSIN HCT) 10-12.5 MG per tablet TAKE 1 TABLET DAILY  . CRESTOR 10 MG tablet TAKE 1 TABLET (10 MG TOTAL) BY MOUTH DAILY.  . fish oil-omega-3 fatty acids 1000 MG capsule Take 2 g by mouth daily.  . hydrocortisone (PROCTOSOL HC) 2.5 % rectal cream PLACE RECTALLY 2 (TWO) TIMES DAILY.  . hydrOXYzine (ATARAX/VISTARIL) 10 MG tablet TAKE 1-2 TABLETS THREE TIMES DAILY AS NEEDED FOR ITCHING  . Multiple Vitamins-Minerals (MENS 50+ MULTI VITAMIN/MIN PO) Take by mouth.  . praziquantel (BILTRICIDE) 600 MG tablet Take 1 tablet (600 mg total) by mouth 3 (three) times daily.     Review of Systems  Constitutional: Negative.   HENT: Negative.   Eyes: Negative.   Respiratory: Negative.  Negative for shortness of breath.   Cardiovascular: Negative.  Negative for chest pain and leg swelling.  Gastrointestinal: Negative.   Genitourinary: Negative.   Musculoskeletal: Negative.   Skin: Negative.   Neurological: Negative.   Psychiatric/Behavioral: Negative.   All other systems reviewed and are negative.      Objective:   Physical  Exam  Constitutional: He is oriented to person, place, and time. He appears well-developed and well-nourished.  HENT:  Head: Normocephalic.  Right Ear: External ear normal.  Left Ear: External ear normal.  Nose: Nose normal.  Mouth/Throat: Oropharynx is clear and moist.  Eyes: Conjunctivae and EOM are normal. Pupils are equal, round, and reactive to light.  Neck: Normal range of motion. Neck supple.  Cardiovascular: Normal rate, regular rhythm, normal heart sounds and intact distal pulses.   Pulmonary/Chest: Effort normal and breath sounds normal.  Abdominal: Soft. Bowel sounds are normal.  Musculoskeletal: Normal range of motion.  Neurological: He is alert and oriented to person, place, and time.  Skin: Skin is warm and dry.  Psychiatric: He has a normal mood and affect. His behavior is normal. Judgment and thought content normal.    BP 126/76 mmHg  Pulse 91  Temp(Src) 97.8 F (36.6 C) (Oral)  Ht 6' (1.829 m)  Wt 261 lb (118.389 kg)  BMI 35.39 kg/m2       Assessment & Plan:  1. Essential hypertension BP doing well on current regimen  2. Hyperlipemia It has been one year since lipids checked - CMP14+EGFR - Lipid panel  Wardell Honour MD

## 2014-08-30 LAB — LIPID PANEL
Chol/HDL Ratio: 2.9 ratio units (ref 0.0–5.0)
Cholesterol, Total: 106 mg/dL (ref 100–199)
HDL: 37 mg/dL — ABNORMAL LOW (ref >39)
LDL Calculated: 31 mg/dL (ref 0–99)
TRIGLYCERIDES: 191 mg/dL — AB (ref 0–149)
VLDL Cholesterol Cal: 38 mg/dL (ref 5–40)

## 2014-08-30 LAB — CMP14+EGFR
ALBUMIN: 4.4 g/dL (ref 3.5–5.5)
ALK PHOS: 63 IU/L (ref 39–117)
ALT: 18 IU/L (ref 0–44)
AST: 17 IU/L (ref 0–40)
Albumin/Globulin Ratio: 2 (ref 1.1–2.5)
BILIRUBIN TOTAL: 0.3 mg/dL (ref 0.0–1.2)
BUN / CREAT RATIO: 13 (ref 9–20)
BUN: 11 mg/dL (ref 6–24)
CO2: 25 mmol/L (ref 18–29)
Calcium: 9.6 mg/dL (ref 8.7–10.2)
Chloride: 99 mmol/L (ref 97–108)
Creatinine, Ser: 0.85 mg/dL (ref 0.76–1.27)
GFR calc non Af Amer: 107 mL/min/{1.73_m2} (ref >59)
GFR, EST AFRICAN AMERICAN: 124 mL/min/{1.73_m2} (ref >59)
Globulin, Total: 2.2 g/dL (ref 1.5–4.5)
Glucose: 98 mg/dL (ref 65–99)
Potassium: 3.9 mmol/L (ref 3.5–5.2)
SODIUM: 141 mmol/L (ref 134–144)
Total Protein: 6.6 g/dL (ref 6.0–8.5)

## 2014-09-10 ENCOUNTER — Telehealth: Payer: Self-pay | Admitting: Family Medicine

## 2014-09-10 MED ORDER — MELOXICAM 15 MG PO TABS: 15.0000 mg | ORAL_TABLET | Freq: Every day | ORAL | Status: DC

## 2014-09-10 NOTE — Telephone Encounter (Signed)
Pt requesting rx for Mobic if approved will go over electronically to the pharmacy.

## 2014-09-11 ENCOUNTER — Ambulatory Visit (INDEPENDENT_AMBULATORY_CARE_PROVIDER_SITE_OTHER): Payer: BLUE CROSS/BLUE SHIELD | Admitting: Family

## 2014-09-11 ENCOUNTER — Encounter: Payer: Self-pay | Admitting: Family

## 2014-09-11 VITALS — BP 130/87 | HR 96 | Temp 98.8°F | Ht 72.0 in | Wt 256.4 lb

## 2014-09-11 DIAGNOSIS — J069 Acute upper respiratory infection, unspecified: Secondary | ICD-10-CM | POA: Diagnosis not present

## 2014-09-11 MED ORDER — BENZONATATE 200 MG PO CAPS: 200.0000 mg | ORAL_CAPSULE | Freq: Three times a day (TID) | ORAL | Status: DC | PRN

## 2014-09-11 MED ORDER — AZITHROMYCIN 250 MG PO TABS: ORAL_TABLET | ORAL | Status: DC

## 2014-09-11 NOTE — Progress Notes (Signed)
   Subjective:    Patient ID: Logan Smith, male    DOB: 02/27/1971, 44 y.o.   MRN: [id number]  Cough This is a new problem. The current episode started yesterday. The problem has been unchanged. The problem occurs constantly. The cough is productive of sputum. Associated symptoms include nasal congestion, rhinorrhea and a sore throat. Pertinent negatives include no chills, ear congestion, ear pain, fever, headaches, postnasal drip or wheezing. The symptoms are aggravated by lying down. He has tried nothing for the symptoms. There is no history of asthma or COPD.      Review of Systems  Constitutional: Negative.  Negative for fever and chills.  HENT: Positive for rhinorrhea and sore throat. Negative for ear pain and postnasal drip.   Respiratory: Positive for cough. Negative for wheezing.   Cardiovascular: Negative.   Gastrointestinal: Negative.   Endocrine: Negative.   Genitourinary: Negative.   Musculoskeletal: Negative.   Neurological: Negative.  Negative for headaches.  Hematological: Negative.   Psychiatric/Behavioral: Negative.   All other systems reviewed and are negative.      Objective:   Physical Exam  Constitutional: He is oriented to person, place, and time. He appears well-developed and well-nourished. No distress.  HENT:  Head: Normocephalic.  Right Ear: External ear normal.  Left Ear: External ear normal.  Nasal passage erythemas with mild swelling  Oropharynx erythemas  Eyes: Pupils are equal, round, and reactive to light. Right eye exhibits no discharge. Left eye exhibits no discharge.  Neck: Normal range of motion. Neck supple. No thyromegaly present.  Cardiovascular: Normal rate, regular rhythm, normal heart sounds and intact distal pulses.   No murmur heard. Pulmonary/Chest: Effort normal and breath sounds normal. No respiratory distress. He has no wheezes.  Abdominal: Soft. Bowel sounds are normal. He exhibits no distension. There is no tenderness.    Musculoskeletal: Normal range of motion. He exhibits no edema or tenderness.  Neurological: He is alert and oriented to person, place, and time. He has normal reflexes. No cranial nerve deficit.  Skin: Skin is warm and dry. No rash noted. No erythema.  Psychiatric: He has a normal mood and affect. His behavior is normal. Judgment and thought content normal.  Vitals reviewed.   BP 130/87 mmHg  Pulse 96  Temp(Src) 98.8 F (37.1 C) (Oral)  Ht 6' (1.829 m)  Wt 256 lb 6.4 oz (116.302 kg)  BMI 34.77 kg/m2       Assessment & Plan:  1. Acute upper respiratory infection -- Take meds as prescribed - Use a cool mist humidifier  -Use saline nose sprays frequently -Saline irrigations of the nose can be very helpful if done frequently.  * 4X daily for 1 week*  * Use of a nettie pot can be helpful with this. Follow directions with this* -Force fluids -For any cough or congestion  Use plain Mucinex- regular strength or max strength is fine   * Children- consult with Pharmacist for dosing -For fever or aces or pains- take tylenol or ibuprofen appropriate for age and weight.  * for fevers greater than 101 orally you may alternate ibuprofen and tylenol every  3 hours. -Throat lozenges if help - azithromycin (ZITHROMAX) 250 MG tablet; Take 500 mg once, then 250 mg for four days  Dispense: 6 tablet; Refill: 0 - benzonatate (TESSALON) 200 MG capsule; Take 1 capsule (200 mg total) by mouth 3 (three) times daily as needed.  Dispense: 30 capsule; Refill: [place], FNP

## 2014-09-11 NOTE — Patient Instructions (Addendum)
Upper Respiratory Infection, Adult An upper respiratory infection (URI) is also sometimes known as the common cold. The upper respiratory tract includes the nose, sinuses, throat, trachea, and bronchi. Bronchi are the airways leading to the lungs. Most people improve within 1 week, but symptoms can last up to 2 weeks. A residual cough may last even longer.  CAUSES Many different viruses can infect the tissues lining the upper respiratory tract. The tissues become irritated and inflamed and often become very moist. Mucus production is also common. A cold is contagious. You can easily spread the virus to others by oral contact. This includes kissing, sharing a glass, coughing, or sneezing. Touching your mouth or nose and then touching a surface, which is then touched by another person, can also spread the virus. SYMPTOMS  Symptoms typically develop 1 to 3 days after you come in contact with a cold virus. Symptoms vary from person to person. They may include:  Runny nose.  Sneezing.  Nasal congestion.  Sinus irritation.  Sore throat.  Loss of voice (laryngitis).  Cough.  Fatigue.  Muscle aches.  Loss of appetite.  Headache.  Low-grade fever. DIAGNOSIS  You might diagnose your own cold based on familiar symptoms, since most people get a cold 2 to 3 times a year. Your caregiver can confirm this based on your exam. Most importantly, your caregiver can check that your symptoms are not due to another disease such as strep throat, sinusitis, pneumonia, asthma, or epiglottitis. Blood tests, throat tests, and X-rays are not necessary to diagnose a common cold, but they may sometimes be helpful in excluding other more serious diseases. Your caregiver will decide if any further tests are required. RISKS AND COMPLICATIONS  You may be at risk for a more severe case of the common cold if you smoke cigarettes, have chronic heart disease (such as heart failure) or lung disease (such as asthma), or if  you have a weakened immune system. The very young and very old are also at risk for more serious infections. Bacterial sinusitis, middle ear infections, and bacterial pneumonia can complicate the common cold. The common cold can worsen asthma and chronic obstructive pulmonary disease (COPD). Sometimes, these complications can require emergency medical care and may be life-threatening. PREVENTION  The best way to protect against getting a cold is to practice good hygiene. Avoid oral or hand contact with people with cold symptoms. Wash your hands often if contact occurs. There is no clear evidence that vitamin C, vitamin E, echinacea, or exercise reduces the chance of developing a cold. However, it is always recommended to get plenty of rest and practice good nutrition. TREATMENT  Treatment is directed at relieving symptoms. There is no cure. Antibiotics are not effective, because the infection is caused by a virus, not by bacteria. Treatment may include:  Increased fluid intake. Sports drinks offer valuable electrolytes, sugars, and fluids.  Breathing heated mist or steam (vaporizer or shower).  Eating chicken soup or other clear broths, and maintaining good nutrition.  Getting plenty of rest.  Using gargles or lozenges for comfort.  Controlling fevers with ibuprofen or acetaminophen as directed by your caregiver.  Increasing usage of your inhaler if you have asthma. Zinc gel and zinc lozenges, taken in the first 24 hours of the common cold, can shorten the duration and lessen the severity of symptoms. Pain medicines may help with fever, muscle aches, and throat pain. A variety of non-prescription medicines are available to treat congestion and runny nose. Your caregiver   can make recommendations and may suggest nasal or lung inhalers for other symptoms.  HOME CARE INSTRUCTIONS   Only take over-the-counter or prescription medicines for pain, discomfort, or fever as directed by your  caregiver.  Use a warm mist humidifier or inhale steam from a shower to increase air moisture. This may keep secretions moist and make it easier to breathe.  Drink enough water and fluids to keep your urine clear or pale yellow.  Rest as needed.  Return to work when your temperature has returned to normal or as your caregiver advises. You may need to stay home longer to avoid infecting others. You can also use a face mask and careful hand washing to prevent spread of the virus. SEEK MEDICAL CARE IF:   After the first few days, you feel you are getting worse rather than better.  You need your caregiver's advice about medicines to control symptoms.  You develop chills, worsening shortness of breath, or brown or red sputum. These may be signs of pneumonia.  You develop yellow or brown nasal discharge or pain in the face, especially when you bend forward. These may be signs of sinusitis.  You develop a fever, swollen neck glands, pain with swallowing, or white areas in the back of your throat. These may be signs of strep throat. SEEK IMMEDIATE MEDICAL CARE IF:   You have a fever.  You develop severe or persistent headache, ear pain, sinus pain, or chest pain.  You develop wheezing, a prolonged cough, cough up blood, or have a change in your usual mucus (if you have chronic lung disease).  You develop sore muscles or a stiff neck. Document Released: 12/22/2000 Document Revised: 09/20/2011 Document Reviewed: 10/03/2013 [employer] Patient Information 2015 [employer]. This information is not intended to replace advice given to you by your health care provider. Make sure you discuss any questions you have with your health care provider.  - Take meds as prescribed - Use a cool mist humidifier  -Use saline nose sprays frequently -Saline irrigations of the nose can be very helpful if done frequently.  * 4X daily for 1 week*  * Use of a nettie pot can be helpful with this. Follow  directions with this* -Force fluids -For any cough or congestion  Use plain Mucinex- regular strength or max strength is fine   * Children- consult with Pharmacist for dosing -For fever or aces or pains- take tylenol or ibuprofen appropriate for age and weight.  * for fevers greater than 101 orally you may alternate ibuprofen and tylenol every  3 hours. -Throat lozenges if help   Christy Hawks, FNP   

## 2014-10-09 ENCOUNTER — Other Ambulatory Visit: Payer: Self-pay | Admitting: *Deleted

## 2014-10-09 MED ORDER — MELOXICAM 15 MG PO TABS: 15.0000 mg | ORAL_TABLET | Freq: Every day | ORAL | Status: DC

## 2014-10-10 ENCOUNTER — Other Ambulatory Visit: Payer: Self-pay | Admitting: *Deleted

## 2014-10-10 MED ORDER — AMLODIPINE BESYLATE 5 MG PO TABS: 5.0000 mg | ORAL_TABLET | Freq: Every day | ORAL | Status: DC

## 2014-10-10 MED ORDER — BENAZEPRIL-HYDROCHLOROTHIAZIDE 10-12.5 MG PO TABS: 1.0000 | ORAL_TABLET | Freq: Every day | ORAL | Status: DC

## 2014-10-10 MED ORDER — ROSUVASTATIN CALCIUM 10 MG PO TABS: ORAL_TABLET | ORAL | Status: DC

## 2014-11-08 ENCOUNTER — Encounter: Payer: Self-pay | Admitting: Family

## 2014-11-08 ENCOUNTER — Ambulatory Visit (INDEPENDENT_AMBULATORY_CARE_PROVIDER_SITE_OTHER): Payer: BLUE CROSS/BLUE SHIELD | Admitting: Family

## 2014-11-08 VITALS — BP 132/89 | HR 97 | Temp 96.5°F | Ht 72.0 in | Wt 256.4 lb

## 2014-11-08 DIAGNOSIS — J301 Allergic rhinitis due to pollen: Secondary | ICD-10-CM

## 2014-11-08 MED ORDER — LORATADINE 10 MG PO TABS: 10.0000 mg | ORAL_TABLET | Freq: Every day | ORAL | Status: DC

## 2014-11-08 MED ORDER — FLUTICASONE PROPIONATE 50 MCG/ACT NA SUSP: 2.0000 | Freq: Every day | NASAL | Status: DC

## 2014-11-08 NOTE — Progress Notes (Signed)
   Subjective:    Patient ID: Logan Smith, male    DOB: 05-28-1971, 44 y.o.   MRN: [id number]  Pt presents to the office today for allergies. PT states it started about a week ago. He states he has sneezing, eye irration, rhinorrhea, and coughing. PT states he has not taken anything for it yet. States he did not know what to take.  Cough This is a new problem. The current episode started in the past 7 days. The problem has been unchanged. The problem occurs every few minutes. Associated symptoms include rhinorrhea. Pertinent negatives include no chills, ear congestion, ear pain, fever, hemoptysis, nasal congestion, shortness of breath or wheezing. The symptoms are aggravated by pollens and lying down. He has tried nothing for the symptoms. The treatment provided no relief.      Review of Systems  Constitutional: Negative.  Negative for fever and chills.  HENT: Positive for rhinorrhea. Negative for ear pain.   Respiratory: Positive for cough. Negative for hemoptysis, shortness of breath and wheezing.   Cardiovascular: Negative.   Gastrointestinal: Negative.   Endocrine: Negative.   Genitourinary: Negative.   Musculoskeletal: Negative.   Neurological: Negative.   Hematological: Negative.   Psychiatric/Behavioral: Negative.   All other systems reviewed and are negative.      Objective:   Physical Exam  Constitutional: He is oriented to person, place, and time. He appears well-developed and well-nourished. No distress.  HENT:  Head: Normocephalic.  Right Ear: External ear normal.  Left Ear: External ear normal.  Mouth/Throat: Oropharynx is clear and moist.  Nasal passage erythemas with mild swelling    Eyes: Pupils are equal, round, and reactive to light. Right eye exhibits no discharge. Left eye exhibits no discharge.  Neck: Normal range of motion. Neck supple. No thyromegaly present.  Cardiovascular: Normal rate, regular rhythm, normal heart sounds and intact distal pulses.     No murmur heard. Pulmonary/Chest: Effort normal and breath sounds normal. No respiratory distress. He has no wheezes.  Abdominal: Soft. Bowel sounds are normal. He exhibits no distension. There is no tenderness.  Musculoskeletal: Normal range of motion. He exhibits no edema or tenderness.  Neurological: He is alert and oriented to person, place, and time. He has normal reflexes. No cranial nerve deficit.  Skin: Skin is warm and dry. No rash noted. No erythema.  Psychiatric: He has a normal mood and affect. His behavior is normal. Judgment and thought content normal.  Vitals reviewed.   BP 132/89 mmHg  Pulse 97  Temp(Src) 96.5 F (35.8 C) (Oral)  Ht 6' (1.829 m)  Wt 256 lb 6.4 oz (116.302 kg)  BMI 34.77 kg/m2       Assessment & Plan:  1. Allergic rhinitis due to pollen -Avoid allergens as much as possible- Mask while doing yard work ect - RTO prn - loratadine (CLARITIN) 10 MG tablet; Take 1 tablet (10 mg total) by mouth daily.  Dispense: 90 tablet; Refill: 3 - fluticasone (FLONASE) 50 MCG/ACT nasal spray; Place 2 sprays into both nostrils daily.  Dispense: 16 g; Refill: [place], FNP

## 2014-11-08 NOTE — Patient Instructions (Signed)

## 2015-01-01 ENCOUNTER — Ambulatory Visit: Payer: BLUE CROSS/BLUE SHIELD | Admitting: Family Medicine

## 2015-01-06 ENCOUNTER — Other Ambulatory Visit: Payer: Self-pay

## 2015-01-06 ENCOUNTER — Encounter: Payer: Self-pay | Admitting: Family Medicine

## 2015-01-06 DIAGNOSIS — J301 Allergic rhinitis due to pollen: Secondary | ICD-10-CM

## 2015-01-06 MED ORDER — MELOXICAM 15 MG PO TABS: 15.0000 mg | ORAL_TABLET | Freq: Every day | ORAL | Status: DC

## 2015-02-05 ENCOUNTER — Encounter: Payer: Self-pay | Admitting: *Deleted

## 2015-02-05 ENCOUNTER — Ambulatory Visit (INDEPENDENT_AMBULATORY_CARE_PROVIDER_SITE_OTHER): Payer: BLUE CROSS/BLUE SHIELD | Admitting: Family

## 2015-02-05 ENCOUNTER — Encounter: Payer: Self-pay | Admitting: Family

## 2015-02-05 VITALS — BP 134/88 | HR 83 | Temp 97.4°F | Ht 72.0 in | Wt 264.0 lb

## 2015-02-05 DIAGNOSIS — R42 Dizziness and giddiness: Secondary | ICD-10-CM

## 2015-02-05 LAB — POCT CBC
Granulocyte percent: 60.2 %G (ref 37–80)
HCT, POC: 42.1 % — AB (ref 43.5–53.7)
HEMOGLOBIN: 13.6 g/dL — AB (ref 14.1–18.1)
Lymph, poc: 2.5 (ref 0.6–3.4)
MCH: 29.2 pg (ref 27–31.2)
MCHC: 32.3 g/dL (ref 31.8–35.4)
MCV: 90.3 fL (ref 80–97)
MPV: 7.6 fL (ref 0–99.8)
PLATELET COUNT, POC: 322 10*3/uL (ref 142–424)
POC Granulocyte: 4.8 (ref 2–6.9)
POC LYMPH PERCENT: 31.7 %L (ref 10–50)
RBC: 4.67 M/uL — AB (ref 4.69–6.13)
RDW, POC: 12.7 %
WBC: 7.9 10*3/uL (ref 4.6–10.2)

## 2015-02-05 NOTE — Patient Instructions (Signed)

## 2015-02-05 NOTE — Progress Notes (Signed)
   Subjective:    Patient ID: Logan Smith, male    DOB: [birth year], 44 y.o.   MRN: [id number]  Dizziness      Review of Systems       Objective:   Physical Exam    BP 134/88 mmHg  Pulse 83  Temp(Src) 97.4 F (36.3 C) (Oral)  Ht 6' (1.829 m)  Wt 264 lb (119.75 kg)  BMI 35.80 kg/m2       Assessment & Plan:

## 2015-02-20 ENCOUNTER — Telehealth: Payer: Self-pay | Admitting: Family Medicine

## 2015-02-20 NOTE — Telephone Encounter (Signed)
Pt NTBS

## 2015-02-20 NOTE — Telephone Encounter (Signed)
Patient aware that he ntbs

## 2015-03-04 ENCOUNTER — Ambulatory Visit (INDEPENDENT_AMBULATORY_CARE_PROVIDER_SITE_OTHER): Payer: BLUE CROSS/BLUE SHIELD | Admitting: Family Medicine

## 2015-03-04 ENCOUNTER — Encounter: Payer: Self-pay | Admitting: Family Medicine

## 2015-03-04 VITALS — BP 136/85 | HR 79 | Temp 97.6°F | Ht 72.0 in | Wt 269.4 lb

## 2015-03-04 DIAGNOSIS — I10 Essential (primary) hypertension: Secondary | ICD-10-CM | POA: Diagnosis not present

## 2015-03-04 DIAGNOSIS — R195 Other fecal abnormalities: Secondary | ICD-10-CM

## 2015-03-04 DIAGNOSIS — B829 Intestinal parasitism, unspecified: Secondary | ICD-10-CM

## 2015-03-04 MED ORDER — MEBENDAZOLE 100 MG PO CHEW: 100.0000 mg | CHEWABLE_TABLET | Freq: Two times a day (BID) | ORAL | Status: DC

## 2015-03-04 NOTE — Progress Notes (Signed)
   Subjective:    Patient ID: Logan Smith, male    DOB: Feb 26, 1971, 44 y.o.   MRN: [id number]  HPI  44 year old gentleman here to follow-up hypertension and hyperlipidemia. Blood pressures are generally well controlled on 3 drug regimen, one of which is diarrhetic. He has no side effects with statin therapy. He has seen some worms in stool specimens and says he was treated here about 2 months ago with medicine but I can find no record of that.  Patient Active Problem List   Diagnosis Date Noted  . 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 Hemorrhoids, internal 11/28/2013  . Hypertension 01/05/2013  . Hyperlipemia 01/05/2013  . ED (erectile dysfunction) 01/05/2013   Outpatient Encounter Prescriptions as of 03/04/2015  Medication Sig  . amLODipine (NORVASC) 5 MG tablet Take 1 tablet (5 mg total) by mouth daily.  . benazepril-hydrochlorthiazide (LOTENSIN HCT) 10-12.5 MG per tablet Take 1 tablet by mouth daily.  . fish oil-omega-3 fatty acids 1000 MG capsule Take 2 g by mouth daily.  . fluticasone (FLONASE) 50 MCG/ACT nasal spray Place 2 sprays into both nostrils daily.  . hydrocortisone (PROCTOSOL HC) 2.5 % rectal cream PLACE RECTALLY 2 (TWO) TIMES DAILY.  . hydrOXYzine (ATARAX/VISTARIL) 10 MG tablet TAKE 1-2 TABLETS THREE TIMES DAILY AS NEEDED FOR ITCHING  . loratadine (CLARITIN) 10 MG tablet Take 1 tablet (10 mg total) by mouth daily.  . meloxicam (MOBIC) 15 MG tablet Take 1 tablet (15 mg total) by mouth daily.  . Multiple Vitamins-Minerals (MENS 50+ MULTI VITAMIN/MIN PO) Take by mouth.  . rosuvastatin (CRESTOR) 10 MG tablet TAKE 1 TABLET (10 MG TOTAL) BY MOUTH DAILY.   No facility-administered encounter medications on file as of 03/04/2015.      Review of Systems  Constitutional: Negative.   HENT: Negative.   Eyes: Negative.   Respiratory: Negative.  Negative for shortness of breath.   Cardiovascular: Negative.  Negative for chest pain and  leg swelling.  Gastrointestinal: Negative.   Genitourinary: Negative.   Musculoskeletal: Negative.   Skin: Negative.   Neurological: Negative.   Psychiatric/Behavioral: Negative.   All other systems reviewed and are negative.      Objective:   Physical Exam  Constitutional: He is oriented to person, place, and time. He appears well-developed and well-nourished.  HENT:  Head: Normocephalic.  Right Ear: External ear normal.  Left Ear: External ear normal.  Nose: Nose normal.  Mouth/Throat: Oropharynx is clear and moist.  Eyes: Conjunctivae and EOM are normal. Pupils are equal, round, and reactive to light.  Neck: Normal range of motion. Neck supple.  Cardiovascular: Normal rate, regular rhythm, normal heart sounds and intact distal pulses.   Pulmonary/Chest: Effort normal and breath sounds normal.  Abdominal: Soft. Bowel sounds are normal.  Musculoskeletal: Normal range of motion.  Neurological: He is alert and oriented to person, place, and time.  Skin: Skin is warm and dry.  Psychiatric: He has a normal mood and affect. His behavior is normal. Judgment and thought content normal.          Assessment & Plan:  1. Essential hypertension Pressure controlled continue with amlodipine, benazepril, and hydrochlorothiazide.   2. Parasites in stool Rx Vermox 100 mg  Wardell Honour MD

## 2015-03-05 ENCOUNTER — Telehealth: Payer: Self-pay | Admitting: Family Medicine

## 2015-03-06 NOTE — Telephone Encounter (Signed)
Emverm 100 mg po x 1

## 2015-03-06 NOTE — Telephone Encounter (Signed)
That's okay

## 2015-03-07 ENCOUNTER — Telehealth: Payer: Self-pay | Admitting: Family Medicine

## 2015-03-14 ENCOUNTER — Other Ambulatory Visit: Payer: Self-pay | Admitting: Family

## 2015-03-20 ENCOUNTER — Encounter: Payer: Self-pay | Admitting: *Deleted

## 2015-05-01 ENCOUNTER — Other Ambulatory Visit: Payer: Self-pay | Admitting: *Deleted

## 2015-05-01 MED ORDER — ROSUVASTATIN CALCIUM 10 MG PO TABS: ORAL_TABLET | ORAL | Status: DC

## 2015-05-14 ENCOUNTER — Other Ambulatory Visit: Payer: Self-pay | Admitting: *Deleted

## 2015-05-14 MED ORDER — AMLODIPINE BESYLATE 5 MG PO TABS: 5.0000 mg | ORAL_TABLET | Freq: Every day | ORAL | Status: DC

## 2015-05-20 ENCOUNTER — Ambulatory Visit (INDEPENDENT_AMBULATORY_CARE_PROVIDER_SITE_OTHER): Payer: BLUE CROSS/BLUE SHIELD | Admitting: Family Medicine

## 2015-05-20 ENCOUNTER — Encounter: Payer: Self-pay | Admitting: Family Medicine

## 2015-05-20 VITALS — BP 119/78 | HR 83 | Temp 97.2°F | Ht 72.0 in | Wt 268.0 lb

## 2015-05-20 DIAGNOSIS — J200 Acute bronchitis due to Mycoplasma pneumoniae: Secondary | ICD-10-CM | POA: Diagnosis not present

## 2015-05-20 MED ORDER — AZITHROMYCIN 250 MG PO TABS: ORAL_TABLET | ORAL | Status: DC

## 2015-05-20 MED ORDER — GUAIFENESIN-CODEINE 100-10 MG/5ML PO SYRP: 5.0000 mL | ORAL_SOLUTION | ORAL | Status: DC | PRN

## 2015-05-20 NOTE — Progress Notes (Signed)
Subjective:     Logan Smith is a 44 y.o. male who presents for evaluation of possible sinus infection. Symptoms include congestion and cough described as nonproductive with no fever, chills, night sweats or weight loss. Onset of symptoms was 2 days ago, gradually worsening since that time.  He is drinking plenty of fluids.  Past history is significant for occasional episodes of bronchitis. Patient is a non-smoker. The following portions of the patient's history were reviewed and updated as appropriate: past family history, past social history and past surgical history.  Review of Systems A comprehensive review of systems was negative except for: Respiratory: positive for cough Musculoskeletal: positive for arthralgias and myalgias    Objective:    BP 119/78 mmHg  Pulse 83  Temp(Src) 97.2 F (36.2 C) (Oral)  Ht 6' (1.829 m)  Wt 268 lb (121.564 kg)  BMI 36.34 kg/m2  SpO2 98%  General Appearance:    Alert, cooperative, no distress, appears stated age  Head:    Normocephalic, without obvious abnormality, atraumatic  Eyes:    PERRL, conjunctiva/corneas clear, EOM's intact, fundi    benign, both eyes       Ears:    Normal TM's and external ear canals, both ears  Nose:   Nares normal, septum midline, mucosa normal, no drainage    or sinus tenderness  Throat:   Lips, mucosa, and tongue normal; teeth and gums normal  Neck:   Supple, symmetrical, trachea midline, no adenopathy;       thyroid:  No enlargement/tenderness/nodules; no carotid   bruit or JVD  Back:     Symmetric, no curvature, ROM normal, no CVA tenderness  Lungs:     Clear to auscultation bilaterally, respirations unlabored  Chest wall:    No tenderness or deformity  Heart:    Regular rate and rhythm, S1 and S2 normal, no murmur, rub   or gallop  Abdomen:     Soft, non-tender, bowel sounds active all four quadrants,    no masses, no organomegaly  Genitalia:    Normal male without lesion, discharge or tenderness  Rectal:     Normal tone, normal prostate, no masses or tenderness;   guaiac negative stool  Extremities:   Extremities normal, atraumatic, no cyanosis or edema  Pulses:   2+ and symmetric all extremities  Skin:   Skin color, texture, turgor normal, no rashes or lesions  Lymph nodes:   Cervical, supraclavicular, and axillary nodes normal  Neurologic:   CNII-XII intact. Normal strength, sensation and reflexes      throughout      Assessment:    Acute bacterial sinusitis    Plan:    1.cheratusin AC 2.zpack 3. Nasal saline rinses as needed for congestion. 4. Follow-up in a few day if symptoms worsen or persist.

## 2015-06-19 ENCOUNTER — Ambulatory Visit: Payer: BLUE CROSS/BLUE SHIELD | Admitting: Family Medicine

## 2015-07-03 ENCOUNTER — Other Ambulatory Visit: Payer: Self-pay | Admitting: *Deleted

## 2015-07-03 MED ORDER — BENAZEPRIL-HYDROCHLOROTHIAZIDE 10-12.5 MG PO TABS: 1.0000 | ORAL_TABLET | Freq: Every day | ORAL | Status: DC

## 2015-07-30 ENCOUNTER — Other Ambulatory Visit: Payer: Self-pay | Admitting: *Deleted

## 2015-07-30 MED ORDER — ROSUVASTATIN CALCIUM 10 MG PO TABS: ORAL_TABLET | ORAL | Status: DC

## 2015-08-06 ENCOUNTER — Encounter: Payer: Self-pay | Admitting: Family Medicine

## 2015-08-06 ENCOUNTER — Ambulatory Visit (INDEPENDENT_AMBULATORY_CARE_PROVIDER_SITE_OTHER): Payer: BLUE CROSS/BLUE SHIELD | Admitting: Family Medicine

## 2015-08-06 VITALS — BP 137/89 | HR 102 | Temp 98.1°F | Ht 72.0 in | Wt 269.1 lb

## 2015-08-06 DIAGNOSIS — J40 Bronchitis, not specified as acute or chronic: Secondary | ICD-10-CM | POA: Diagnosis not present

## 2015-08-06 DIAGNOSIS — J329 Chronic sinusitis, unspecified: Secondary | ICD-10-CM

## 2015-08-06 MED ORDER — AMOXICILLIN-POT CLAVULANATE 875-125 MG PO TABS: 1.0000 | ORAL_TABLET | Freq: Two times a day (BID) | ORAL | Status: DC

## 2015-08-06 MED ORDER — PSEUDOEPHEDRINE-GUAIFENESIN ER 60-600 MG PO TB12: 1.0000 | ORAL_TABLET | Freq: Two times a day (BID) | ORAL | Status: AC

## 2015-08-06 MED ORDER — BETAMETHASONE SOD PHOS & ACET 6 (3-3) MG/ML IJ SUSP
6.0000 mg | Freq: Once | INTRAMUSCULAR | Status: AC
  Administered 2015-08-06: 6 mg via INTRAMUSCULAR

## 2015-08-06 NOTE — Progress Notes (Signed)
Subjective:  Patient ID: Logan Smith, male    DOB: [birth year]  Age: 45 y.o. MRN: [id number]  CC: Nasal Congestion   HPI Logan Smith presents for cough with green sputum for 2 days. Sinus congestion. Denies drainage, ST & fever. Not dyspneic   History Logan Smith has a past medical history of Hyperlipidemia; Hypertension; Erectile dysfunction; and Back pain.   He has past surgical history that includes Hemorroidectomy.   His family history includes Diabetes in his mother; Hypertension in his father and mother.He reports that he has never smoked. He does not have any smokeless tobacco history on file. He reports that he does not drink alcohol or use illicit drugs.    ROS Review of Systems  Constitutional: Negative for fever, chills, activity change and appetite change.  HENT: Positive for congestion and sinus pressure. Negative for ear discharge, ear pain, hearing loss, nosebleeds, postnasal drip, rhinorrhea, sneezing and trouble swallowing.   Respiratory: Positive for cough. Negative for chest tightness and shortness of breath.   Cardiovascular: Negative for chest pain and palpitations.  Skin: Negative for rash.    Objective:  BP 137/89 mmHg  Pulse 102  Temp(Src) 98.1 F (36.7 C) (Oral)  Ht 6' (1.829 m)  Wt 269 lb 2 oz (122.074 kg)  BMI 36.49 kg/m2  BP Readings from Last 3 Encounters:  08/06/15 137/89  05/20/15 119/78  03/04/15 136/85    Wt Readings from Last 3 Encounters:  08/06/15 269 lb 2 oz (122.074 kg)  05/20/15 268 lb (121.564 kg)  03/04/15 269 lb 6.4 oz (122.199 kg)     Physical Exam  Constitutional: He appears well-developed and well-nourished.  HENT:  Head: Normocephalic and atraumatic.  Right Ear: Tympanic membrane and external ear normal. No decreased hearing is noted.  Left Ear: Tympanic membrane and external ear normal. No decreased hearing is noted.  Nose: Mucosal edema present. Right sinus exhibits maxillary sinus tenderness. Right sinus  exhibits no frontal sinus tenderness. Left sinus exhibits maxillary sinus tenderness. Left sinus exhibits no frontal sinus tenderness.  Mouth/Throat: No oropharyngeal exudate or posterior oropharyngeal erythema.  Neck: No Brudzinski's sign noted.  Pulmonary/Chest: No respiratory distress. He has wheezes (mild decrease exp phase).  Lymphadenopathy:       Head (right side): No preauricular adenopathy present.       Head (left side): No preauricular adenopathy present.       Right cervical: No superficial cervical adenopathy present.      Left cervical: No superficial cervical adenopathy present.     Lab Results  Component Value Date   WBC 7.9 02/05/2015   HGB 13.6* 02/05/2015   HCT 42.1* 02/05/2015   GLUCOSE 98 08/29/2014   CHOL 106 08/29/2014   TRIG 191* 08/29/2014   HDL 37* 08/29/2014   LDLCALC 31 08/29/2014   ALT 18 08/29/2014   AST 17 08/29/2014   NA 141 08/29/2014   K 3.9 08/29/2014   CL 99 08/29/2014   CREATININE 0.85 08/29/2014   BUN 11 08/29/2014   CO2 25 08/29/2014   PSA 1.7 04/09/2013   HGBA1C 5.1 02/25/2014    Patient was never admitted.  Assessment & Plan:   Logan Smith was seen today for nasal congestion.  Diagnoses and all orders for this visit:  Sinobronchitis -     betamethasone acetate-betamethasone sodium phosphate (CELESTONE) injection 6 mg; Inject 1 mL (6 mg total) into the muscle once.  Other orders -     amoxicillin-clavulanate (AUGMENTIN) 875-125 MG tablet; Take 1 tablet by mouth  2 (two) times daily. Take all of this medication -     pseudoephedrine-guaifenesin (MUCINEX D) 60-600 MG 12 hr tablet; Take 1 tablet by mouth every 12 (twelve) hours. As needed for congestion      I have discontinued Mr. Branigan hydrOXYzine, loratadine, azithromycin, and guaiFENesin-codeine. I am also having him start on amoxicillin-clavulanate and pseudoephedrine-guaifenesin. Additionally, I am having him maintain his fish oil-omega-3 fatty acids, Multiple  Vitamins-Minerals (MENS 50+ MULTI VITAMIN/MIN PO), meloxicam, amLODipine, benazepril-hydrochlorthiazide, and rosuvastatin. We will continue to administer betamethasone acetate-betamethasone sodium phosphate.  Meds ordered this encounter  Medications  . betamethasone acetate-betamethasone sodium phosphate (CELESTONE) injection 6 mg    Sig:   . amoxicillin-clavulanate (AUGMENTIN) 875-125 MG tablet    Sig: Take 1 tablet by mouth 2 (two) times daily. Take all of this medication    Dispense:  20 tablet    Refill:  0  . pseudoephedrine-guaifenesin (MUCINEX D) 60-600 MG 12 hr tablet    Sig: Take 1 tablet by mouth every 12 (twelve) hours. As needed for congestion    Dispense:  20 tablet    Refill:  0     Follow-up: Return if symptoms worsen or fail to improve.  Claretta Fraise, M.D.

## 2015-09-03 ENCOUNTER — Encounter: Payer: Self-pay | Admitting: Family Medicine

## 2015-09-03 ENCOUNTER — Ambulatory Visit (INDEPENDENT_AMBULATORY_CARE_PROVIDER_SITE_OTHER): Payer: BLUE CROSS/BLUE SHIELD | Admitting: Family Medicine

## 2015-09-03 VITALS — BP 134/86 | HR 81 | Temp 97.2°F | Ht 72.0 in | Wt 274.4 lb

## 2015-09-03 DIAGNOSIS — I10 Essential (primary) hypertension: Secondary | ICD-10-CM | POA: Diagnosis not present

## 2015-09-03 DIAGNOSIS — E785 Hyperlipidemia, unspecified: Secondary | ICD-10-CM

## 2015-09-03 NOTE — Progress Notes (Signed)
   Subjective:    Patient ID: Logan Smith, male    DOB: May 02, 1971, 45 y.o.   MRN: [id number]  HPI 45 year old gentleman who is here to follow-up hypertension and hyperlipidemia. He has been taking his medicines as prescribed. He denies any side effects. He has no other complaints or symptoms today. He works for [employer] does a lot of lifting and bending has some occasional back pain. He takes meloxicam for this discomfort. It it is effective.     Review of Systems  Constitutional: Negative.   HENT: Negative.   Eyes: Negative.   Respiratory: Negative.  Negative for shortness of breath.   Cardiovascular: Negative.  Negative for chest pain and leg swelling.  Gastrointestinal: Negative.   Genitourinary: Negative.   Musculoskeletal: Negative.   Skin: Negative.   Neurological: Negative.   Psychiatric/Behavioral: Negative.   All other systems reviewed and are negative.  Patient Active Problem List   Diagnosis Date Noted  . 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 Hemorrhoids, internal 11/28/2013  . Hypertension 01/05/2013  . Hyperlipemia 01/05/2013  . ED (erectile dysfunction) 01/05/2013   Outpatient Encounter Prescriptions as of 09/03/2015  Medication Sig  . amLODipine (NORVASC) 5 MG tablet Take 1 tablet (5 mg total) by mouth daily.  . benazepril-hydrochlorthiazide (LOTENSIN HCT) 10-12.5 MG tablet Take 1 tablet by mouth daily.  . fish oil-omega-3 fatty acids 1000 MG capsule Take 2 g by mouth daily.  . meloxicam (MOBIC) 15 MG tablet TAKE 1 TABLET (15 MG TOTAL) BY MOUTH DAILY.  . Multiple Vitamins-Minerals (MENS 50+ MULTI VITAMIN/MIN PO) Take by mouth.  . rosuvastatin (CRESTOR) 10 MG tablet TAKE 1 TABLET (10 MG TOTAL) BY MOUTH DAILY.  . [DISCONTINUED] amoxicillin-clavulanate (AUGMENTIN) 875-125 MG tablet Take 1 tablet by mouth 2 (two) times daily. Take all of this medication   No facility-administered encounter medications on file as of  09/03/2015.       Objective:   Physical Exam  Constitutional: He is oriented to person, place, and time. He appears well-developed and well-nourished.  HENT:  Head: Normocephalic.  Right Ear: External ear normal.  Left Ear: External ear normal.  Nose: Nose normal.  Mouth/Throat: Oropharynx is clear and moist.  Eyes: Conjunctivae and EOM are normal. Pupils are equal, round, and reactive to light.  Neck: Normal range of motion. Neck supple.  Cardiovascular: Normal rate, regular rhythm, normal heart sounds and intact distal pulses.   Pulmonary/Chest: Effort normal and breath sounds normal.  Abdominal: Soft. Bowel sounds are normal.  Musculoskeletal: Normal range of motion.  Neurological: He is alert and oriented to person, place, and time.  Skin: Skin is warm and dry.  Psychiatric: He has a normal mood and affect. His behavior is normal. Judgment and thought content normal.          Assessment & Plan:  1. Hyperlipidemia LDL was at goal when last checked 1 year ago. Need to obtain and monitor liver test today - CMP14+EGFR - Lipid panel  2. Essential hypertension Pressure is well controlled on combination benazepril, hydrochlorothiazide, and amlodipine.  Wardell Honour MD

## 2015-09-04 ENCOUNTER — Other Ambulatory Visit: Payer: Self-pay | Admitting: *Deleted

## 2015-09-04 ENCOUNTER — Ambulatory Visit: Payer: BLUE CROSS/BLUE SHIELD | Admitting: Family Medicine

## 2015-09-04 LAB — LIPID PANEL
CHOL/HDL RATIO: 2.6 ratio (ref 0.0–5.0)
Cholesterol, Total: 113 mg/dL (ref 100–199)
HDL: 43 mg/dL (ref >39)
LDL CALC: 52 mg/dL (ref 0–99)
Triglycerides: 89 mg/dL (ref 0–149)
VLDL CHOLESTEROL CAL: 18 mg/dL (ref 5–40)

## 2015-09-04 LAB — CMP14+EGFR
ALBUMIN: 4.3 g/dL (ref 3.5–5.5)
ALK PHOS: 71 IU/L (ref 39–117)
ALT: 21 IU/L (ref 0–44)
AST: 18 IU/L (ref 0–40)
Albumin/Globulin Ratio: 1.7 (ref 1.1–2.5)
BUN / CREAT RATIO: 14 (ref 9–20)
BUN: 12 mg/dL (ref 6–24)
Bilirubin Total: 0.3 mg/dL (ref 0.0–1.2)
CO2: 26 mmol/L (ref 18–29)
CREATININE: 0.84 mg/dL (ref 0.76–1.27)
Calcium: 9.7 mg/dL (ref 8.7–10.2)
Chloride: 98 mmol/L (ref 96–106)
GFR, EST AFRICAN AMERICAN: 124 mL/min/{1.73_m2} (ref >59)
GFR, EST NON AFRICAN AMERICAN: 107 mL/min/{1.73_m2} (ref >59)
GLOBULIN, TOTAL: 2.6 g/dL (ref 1.5–4.5)
GLUCOSE: 101 mg/dL — AB (ref 65–99)
Potassium: 3.9 mmol/L (ref 3.5–5.2)
SODIUM: 141 mmol/L (ref 134–144)
TOTAL PROTEIN: 6.9 g/dL (ref 6.0–8.5)

## 2015-09-04 MED ORDER — AMLODIPINE BESYLATE 5 MG PO TABS: 5.0000 mg | ORAL_TABLET | Freq: Every day | ORAL | Status: DC

## 2015-10-03 ENCOUNTER — Ambulatory Visit (INDEPENDENT_AMBULATORY_CARE_PROVIDER_SITE_OTHER): Payer: BLUE CROSS/BLUE SHIELD | Admitting: Family Medicine

## 2015-10-03 ENCOUNTER — Encounter: Payer: Self-pay | Admitting: Family Medicine

## 2015-10-03 VITALS — BP 134/86 | HR 90 | Temp 98.7°F | Ht 73.83 in | Wt 266.2 lb

## 2015-10-03 DIAGNOSIS — A084 Viral intestinal infection, unspecified: Secondary | ICD-10-CM | POA: Diagnosis not present

## 2015-10-03 NOTE — Progress Notes (Signed)
   HPI  Patient presents today a severe diarrhea.  Patient explains that her last 2 days had diarrhea. He's had 2-3 episodes today, he continues to have abdominal discomfort and gurgling. He is tolerating foods and fluids normally. He denies blood in his stool. He denies any vomiting or nausea. His son had a similar illness that lasted about 2 days. 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   PMH: Smoking status noted ROS: Per HPI  Objective: BP 134/86 mmHg  Pulse 90  Temp(Src) 98.7 F (37.1 C) (Oral)  Ht 6' 1.83" (1.875 m)  Wt 266 lb 3.2 oz (120.748 kg)  BMI 34.35 kg/m2 Gen: NAD, alert, cooperative with exam HEENT: NCAT, MMM CV: RRR, good S1/S2, no murmur Resp: CTABL, no wheezes, non-labored Abd: SNTND, hyperactive BS present, no guarding or organomegaly Ext: No edema, warm Neuro: Alert and oriented, No gross deficits  Assessment and plan:  # Viral enteritis Discussed supportive care, likely viral given sick contact immodium or pepto if needed Fluids Hold HCTZ/Benazapril X 3 days RTC with any concerns, worsening symptoms, or failure to improve   Laroy Apple, MD [facility] 10/03/2015, 4:02 PM

## 2015-10-03 NOTE — Patient Instructions (Signed)
Great to meet you!  Get plenty of fluids, try immodium or pepto if needed  Call or come back if you have any concerns

## 2015-10-08 ENCOUNTER — Other Ambulatory Visit: Payer: Self-pay

## 2015-10-08 MED ORDER — BENAZEPRIL-HYDROCHLOROTHIAZIDE 10-12.5 MG PO TABS: 1.0000 | ORAL_TABLET | Freq: Every day | ORAL | Status: DC

## 2015-10-18 ENCOUNTER — Other Ambulatory Visit: Payer: Self-pay | Admitting: Family

## 2015-11-05 ENCOUNTER — Telehealth: Payer: Self-pay | Admitting: Family Medicine

## 2015-11-05 ENCOUNTER — Other Ambulatory Visit: Payer: Self-pay | Admitting: Family Medicine

## 2015-11-05 DIAGNOSIS — Z3009 Encounter for other general counseling and advice on contraception: Secondary | ICD-10-CM

## 2015-11-05 NOTE — Telephone Encounter (Signed)
Referral placed.

## 2015-11-05 NOTE — Telephone Encounter (Signed)
Please do a referral for vasectomy

## 2016-01-06 ENCOUNTER — Ambulatory Visit (INDEPENDENT_AMBULATORY_CARE_PROVIDER_SITE_OTHER): Payer: BLUE CROSS/BLUE SHIELD | Admitting: Family Medicine

## 2016-01-06 ENCOUNTER — Encounter: Payer: Self-pay | Admitting: Family Medicine

## 2016-01-06 VITALS — BP 137/80 | HR 80 | Temp 98.4°F | Ht 73.83 in | Wt 259.0 lb

## 2016-01-06 DIAGNOSIS — N522 Drug-induced erectile dysfunction: Secondary | ICD-10-CM

## 2016-01-06 MED ORDER — SILDENAFIL CITRATE 100 MG PO TABS: 50.0000 mg | ORAL_TABLET | Freq: Every day | ORAL | Status: DC | PRN

## 2016-01-06 NOTE — Progress Notes (Signed)
   Subjective:    Patient ID: Logan Smith, male    DOB: 30-May-1971, 45 y.o.   MRN: [id number]  HPI  Patient here today for Male issues. The problem is an issue with sexual performance. He is on 3 different blood pressure pills including diuretic ACE inhibitor and calcium channel blocker. His energy levels and general demeanor are upbeat and he is not the typical person with low testosterone although we did discuss that at some length as a possible etiology. We also discussed the importance of the psychological aspect in that if he did not perform as desired once or twice there was a strong element of performance anxiety which might contribute to the problem.      Patient Active Problem List   Diagnosis Date Noted  . 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 Hemorrhoids, internal 11/28/2013  . Hypertension 01/05/2013  . Hyperlipemia 01/05/2013  . ED (erectile dysfunction) 01/05/2013   Outpatient Encounter Prescriptions as of 01/06/2016  Medication Sig  . amLODipine (NORVASC) 5 MG tablet Take 1 tablet (5 mg total) by mouth daily.  . benazepril-hydrochlorthiazide (LOTENSIN HCT) 10-12.5 MG tablet Take 1 tablet by mouth daily.  . fish oil-omega-3 fatty acids 1000 MG capsule Take 2 g by mouth daily.  . meloxicam (MOBIC) 15 MG tablet TAKE 1 TABLET (15 MG TOTAL) BY MOUTH DAILY.  . Multiple Vitamins-Minerals (MENS 50+ MULTI VITAMIN/MIN PO) Take by mouth.  . rosuvastatin (CRESTOR) 10 MG tablet TAKE 1 TABLET (10 MG TOTAL) BY MOUTH DAILY.   No facility-administered encounter medications on file as of 01/06/2016.     Review of Systems  Constitutional: Negative.   HENT: Negative.   Eyes: Negative.   Respiratory: Negative.   Cardiovascular: Negative.   Gastrointestinal: Negative.   Endocrine: Negative.   Genitourinary: Negative.   Musculoskeletal: Negative.   Skin: Negative.   Allergic/Immunologic: Negative.   Neurological: Negative.     Hematological: Negative.   Psychiatric/Behavioral: Negative.        Objective:   Physical Exam  Constitutional: He is oriented to person, place, and time. He appears well-developed and well-nourished.  Cardiovascular: Normal rate and regular rhythm.   Pulmonary/Chest: Effort normal and breath sounds normal.  Neurological: He is alert and oriented to person, place, and time.  Psychiatric: He has a normal mood and affect. His behavior is normal.    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 Rather than trying to switch around blood pressure pills and his pressure is well controlled will try Viagra. I have given him a coupon. For this will help to fray the cost. He will let us know how things go  Wardell Honour MD

## 2016-01-07 ENCOUNTER — Telehealth: Payer: Self-pay | Admitting: Family Medicine

## 2016-01-07 NOTE — Telephone Encounter (Signed)
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 Letter printed and up front for pt to pick up. Pt aware.

## 2016-01-30 ENCOUNTER — Encounter: Payer: Self-pay | Admitting: Family

## 2016-01-30 ENCOUNTER — Ambulatory Visit (INDEPENDENT_AMBULATORY_CARE_PROVIDER_SITE_OTHER): Payer: BLUE CROSS/BLUE SHIELD | Admitting: Family

## 2016-01-30 VITALS — BP 139/95 | HR 83 | Temp 97.8°F | Ht 73.0 in | Wt 263.6 lb

## 2016-01-30 DIAGNOSIS — J069 Acute upper respiratory infection, unspecified: Secondary | ICD-10-CM | POA: Diagnosis not present

## 2016-01-30 DIAGNOSIS — E669 Obesity, unspecified: Secondary | ICD-10-CM | POA: Diagnosis not present

## 2016-01-30 MED ORDER — FLUTICASONE PROPIONATE 50 MCG/ACT NA SUSP: 2.0000 | Freq: Every day | NASAL | Status: DC

## 2016-01-30 NOTE — Progress Notes (Signed)
   Subjective:    Patient ID: Logan Smith, male    DOB: 10/16/1970, 45 y.o.   MRN: [id number]  Cough This is a new problem. The current episode started 1 to 4 weeks ago. The problem has been unchanged. The problem occurs every few minutes. The cough is productive of purulent sputum. Associated symptoms include headaches. Pertinent negatives include no chills, ear congestion, ear pain, fever, myalgias, nasal congestion, postnasal drip, sore throat, shortness of breath or wheezing. The symptoms are aggravated by lying down. He has tried rest and OTC cough suppressant for the symptoms. The treatment provided mild relief. There is no history of asthma or COPD.      Review of Systems  Constitutional: Negative.  Negative for fever and chills.  HENT: Negative for ear pain, postnasal drip and sore throat.   Respiratory: Positive for cough. Negative for shortness of breath and wheezing.   Cardiovascular: Negative.   Gastrointestinal: Negative.   Endocrine: Negative.   Genitourinary: Negative.   Musculoskeletal: Negative.  Negative for myalgias.  Neurological: Positive for headaches.  Hematological: Negative.   Psychiatric/Behavioral: Negative.   All other systems reviewed and are negative.      Objective:   Physical Exam  Constitutional: He is oriented to person, place, and time. He appears well-developed and well-nourished. No distress.  HENT:  Head: Normocephalic.  Right Ear: External ear normal.  Left Ear: External ear normal.  Mouth/Throat: Oropharynx is clear and moist.  Nasal passage erythemas with mild swelling    Eyes: Pupils are equal, round, and reactive to light. Right eye exhibits no discharge. Left eye exhibits no discharge.  Neck: Normal range of motion. Neck supple. No thyromegaly present.  Cardiovascular: Normal rate, regular rhythm, normal heart sounds and intact distal pulses.   No murmur heard. Pulmonary/Chest: Effort normal and breath sounds normal. No  respiratory distress. He has no wheezes.  Intermittent non productive cough   Abdominal: Soft. Bowel sounds are normal. He exhibits no distension. There is no tenderness.  Musculoskeletal: Normal range of motion. He exhibits no edema or tenderness.  Neurological: He is alert and oriented to person, place, and time.  Skin: Skin is warm and dry. No rash noted. No erythema.  Psychiatric: He has a normal mood and affect. His behavior is normal. Judgment and thought content normal.  Vitals reviewed.   BP 139/95 mmHg  Pulse 83  Temp(Src) 97.8 F (36.6 C) (Oral)  Ht 6\' 1"  (1.854 m)  Wt 263 lb 9.6 oz (119.568 kg)  BMI 34.79 kg/m2       Assessment & Plan:  1. Obesity (BMI 30-39.9)  2. Acute upper respiratory infection -- Take meds as prescribed - Use a cool mist humidifier  -Use saline nose sprays frequently -Saline irrigations of the nose can be very helpful if done frequently.  * 4X daily for 1 week*  * Use of a nettie pot can be helpful with this. Follow directions with this* -Force fluids -For any cough or congestion  Use plain Mucinex- regular strength or max strength is fine   * Children- consult with Pharmacist for dosing -For fever or aces or pains- take tylenol or ibuprofen appropriate for age and weight.  * for fevers greater than 101 orally you may alternate ibuprofen and tylenol every  3 hours. -Throat lozenges if help - fluticasone (FLONASE) 50 MCG/ACT nasal spray; Place 2 sprays into both nostrils daily.  Dispense: 16 g; Refill: Ocean Breeze, FNP

## 2016-01-30 NOTE — Patient Instructions (Signed)
Upper Respiratory Infection, Adult Most upper respiratory infections (URIs) are a viral infection of the air passages leading to the lungs. A URI affects the nose, throat, and upper air passages. The most common type of URI is nasopharyngitis and is typically referred to as "the common cold." URIs run their course and usually go away on their own. Most of the time, a URI does not require medical attention, but sometimes a bacterial infection in the upper airways can follow a viral infection. This is called a secondary infection. Sinus and middle ear infections are common types of secondary upper respiratory infections. Bacterial pneumonia can also complicate a URI. A URI can worsen asthma and chronic obstructive pulmonary disease (COPD). Sometimes, these complications can require emergency medical care and may be life threatening.  CAUSES Almost all URIs are caused by viruses. A virus is a type of germ and can spread from one person to another.  RISKS FACTORS You may be at risk for a URI if:   You smoke.   You have chronic heart or lung disease.  You have a weakened defense (immune) system.   You are very young or very old.   You have nasal allergies or asthma.  You work in crowded or poorly ventilated areas.  You work in health care facilities or schools. SIGNS AND SYMPTOMS  Symptoms typically develop 2-3 days after you come in contact with a cold virus. Most viral URIs last 7-10 days. However, viral URIs from the influenza virus (flu virus) can last 14-18 days and are typically more severe. Symptoms may include:   Runny or stuffy (congested) nose.   Sneezing.   Cough.   Sore throat.   Headache.   Fatigue.   Fever.   Loss of appetite.   Pain in your forehead, behind your eyes, and over your cheekbones (sinus pain).  Muscle aches.  DIAGNOSIS  Your health care provider may diagnose a URI by:  Physical exam.  Tests to check that your symptoms are not due to  another condition such as:  Strep throat.  Sinusitis.  Pneumonia.  Asthma. TREATMENT  A URI goes away on its own with time. It cannot be cured with medicines, but medicines may be prescribed or recommended to relieve symptoms. Medicines may help:  Reduce your fever.  Reduce your cough.  Relieve nasal congestion. HOME CARE INSTRUCTIONS   Take medicines only as directed by your health care provider.   Gargle warm saltwater or take cough drops to comfort your throat as directed by your health care provider.  Use a warm mist humidifier or inhale steam from a shower to increase air moisture. This may make it easier to breathe.  Drink enough fluid to keep your urine clear or pale yellow.   Eat soups and other clear broths and maintain good nutrition.   Rest as needed.   Return to work when your temperature has returned to normal or as your health care provider advises. You may need to stay home longer to avoid infecting others. You can also use a face mask and careful hand washing to prevent spread of the virus.  Increase the usage of your inhaler if you have asthma.   Do not use any tobacco products, including cigarettes, chewing tobacco, or electronic cigarettes. If you need help quitting, ask your health care provider. PREVENTION  The best way to protect yourself from getting a cold is to practice good hygiene.   Avoid oral or hand contact with people with cold   symptoms.   Wash your hands often if contact occurs.  There is no clear evidence that vitamin C, vitamin E, echinacea, or exercise reduces the chance of developing a cold. However, it is always recommended to get plenty of rest, exercise, and practice good nutrition.  SEEK MEDICAL CARE IF:   You are getting worse rather than better.   Your symptoms are not controlled by medicine.   You have chills.  You have worsening shortness of breath.  You have brown or red mucus.  You have yellow or brown nasal  discharge.  You have pain in your face, especially when you bend forward.  You have a fever.  You have swollen neck glands.  You have pain while swallowing.  You have white areas in the back of your throat. SEEK IMMEDIATE MEDICAL CARE IF:   You have severe or persistent:  Headache.  Ear pain.  Sinus pain.  Chest pain.  You have chronic lung disease and any of the following:  Wheezing.  Prolonged cough.  Coughing up blood.  A change in your usual mucus.  You have a stiff neck.  You have changes in your:  Vision.  Hearing.  Thinking.  Mood. MAKE SURE YOU:   Understand these instructions.  Will watch your condition.  Will get help right away if you are not doing well or get worse.   This information is not intended to replace advice given to you by your health care provider. Make sure you discuss any questions you have with your health care provider.   Document Released: 12/22/2000 Document Revised: 11/12/2014 Document Reviewed: 10/03/2013 Elsevier Interactive Patient Education 2016 [employer]  - Take meds as prescribed - Use a cool mist humidifier  -Use saline nose sprays frequently -Saline irrigations of the nose can be very helpful if done frequently.  * 4X daily for 1 week*  * Use of a nettie pot can be helpful with this. Follow directions with this* -Force fluids -For any cough or congestion  Use plain Mucinex- regular strength or max strength is fine   * Children- consult with Pharmacist for dosing -For fever or aces or pains- take tylenol or ibuprofen appropriate for age and weight.  * for fevers greater than 101 orally you may alternate ibuprofen and tylenol every  3 hours. -Throat lozenges if help   Katheryne Gorr, FNP   

## 2016-03-03 ENCOUNTER — Ambulatory Visit: Payer: BLUE CROSS/BLUE SHIELD | Admitting: Family Medicine

## 2016-03-04 ENCOUNTER — Other Ambulatory Visit: Payer: Self-pay | Admitting: Family Medicine

## 2016-03-26 ENCOUNTER — Other Ambulatory Visit: Payer: Self-pay | Admitting: Family Medicine

## 2016-04-13 ENCOUNTER — Ambulatory Visit: Payer: BLUE CROSS/BLUE SHIELD | Admitting: Family Medicine

## 2016-04-26 ENCOUNTER — Other Ambulatory Visit: Payer: Self-pay | Admitting: Family Medicine

## 2016-04-27 ENCOUNTER — Ambulatory Visit: Payer: BLUE CROSS/BLUE SHIELD | Admitting: Family Medicine

## 2016-05-20 ENCOUNTER — Ambulatory Visit: Payer: BLUE CROSS/BLUE SHIELD | Admitting: Family Medicine

## 2016-05-21 ENCOUNTER — Ambulatory Visit (INDEPENDENT_AMBULATORY_CARE_PROVIDER_SITE_OTHER): Payer: BLUE CROSS/BLUE SHIELD | Admitting: Family Medicine

## 2016-05-21 ENCOUNTER — Encounter: Payer: Self-pay | Admitting: Family Medicine

## 2016-05-21 VITALS — BP 173/121 | HR 69 | Temp 97.6°F | Ht 73.0 in | Wt 259.0 lb

## 2016-05-21 DIAGNOSIS — E7800 Pure hypercholesterolemia, unspecified: Secondary | ICD-10-CM

## 2016-05-21 DIAGNOSIS — I10 Essential (primary) hypertension: Secondary | ICD-10-CM

## 2016-05-21 DIAGNOSIS — N529 Male erectile dysfunction, unspecified: Secondary | ICD-10-CM | POA: Diagnosis not present

## 2016-05-21 MED ORDER — BENAZEPRIL-HYDROCHLOROTHIAZIDE 10-12.5 MG PO TABS: 1.0000 | ORAL_TABLET | Freq: Every day | ORAL | 1 refills | Status: DC

## 2016-05-21 NOTE — Progress Notes (Signed)
   Subjective:    Patient ID: Logan Smith, male    DOB: 1970-10-09, 45 y.o.   MRN: [id number]  HPI Pt here for follow up and management of chronic medical problems which includes hyperlipidemia and hypertension. He is taking medications regularly. He has no particular concerns today. Her pressure is elevated today at 141/95. Current medicines include benazepril with hydrochlorothiazide and amlodipine.    Patient Active Problem List   Diagnosis Date Noted  . Obesity (BMI 30-39.9) 01/30/2016  . 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 Hemorrhoids, internal 11/28/2013  . Hypertension 01/05/2013  . Hyperlipemia 01/05/2013  . ED (erectile dysfunction) 01/05/2013   Outpatient Encounter Prescriptions as of 05/21/2016  Medication Sig  . amLODipine (NORVASC) 5 MG tablet Take 1 tablet (5 mg total) by mouth daily.  . benazepril-hydrochlorthiazide (LOTENSIN HCT) 10-12.5 MG tablet TAKE 1 TABLET BY MOUTH DAILY.  . fish oil-omega-3 fatty acids 1000 MG capsule Take 2 g by mouth daily.  . fluticasone (FLONASE) 50 MCG/ACT nasal spray Place 2 sprays into both nostrils daily.  . meloxicam (MOBIC) 15 MG tablet TAKE 1 TABLET BY MOUTH DAILY  . Multiple Vitamins-Minerals (MENS 50+ MULTI VITAMIN/MIN PO) Take by mouth.  . rosuvastatin (CRESTOR) 10 MG tablet TAKE 1 TABLET (10 MG TOTAL) BY MOUTH DAILY.  . [DISCONTINUED] sildenafil (VIAGRA) 100 MG tablet Take 0.5-1 tablets (50-100 mg total) by mouth daily as needed for erectile dysfunction.   No facility-administered encounter medications on file as of 05/21/2016.       Review of Systems  Constitutional: Negative.   HENT: Negative.   Eyes: Negative.   Respiratory: Negative.   Cardiovascular: Negative.   Gastrointestinal: Negative.   Endocrine: Negative.   Genitourinary: Negative.   Musculoskeletal: Negative.   Skin: Negative.   Allergic/Immunologic: Negative.   Neurological: Negative.   Hematological:  Negative.   Psychiatric/Behavioral: Negative.        Objective:   Physical Exam  Constitutional: He is oriented to person, place, and time. He appears well-developed and well-nourished.  Cardiovascular: Normal rate, regular rhythm, normal heart sounds and intact distal pulses.   Pulmonary/Chest: Effort normal and breath sounds normal.  Neurological: He is alert and oriented to person, place, and time.  Psychiatric: He has a normal mood and affect. His behavior is normal.   BP (!) 141/95 (BP Location: Right Arm)   Pulse 69   Temp 97.6 F (36.4 C) (Oral)   Ht 6\' 1"  (1.854 m)   Wt 259 lb (117.5 kg)   BMI 34.17 kg/m         Assessment & Plan:  1. Pure hypercholesterolemia Lipids are well controlled on rosuvastatin. Will recheck next visit  2. Essential hypertension Increase amlodipine to 10 mg and follow   3. Erectile dysfunction, unspecified erectile dysfunction type We will try generic sildenafil 20 mg take 2-5 tablets when necessary  Wardell Honour MD

## 2016-05-21 NOTE — Patient Instructions (Signed)
Continue current medications. Continue good therapeutic lifestyle changes which include good diet and exercise. Fall precautions discussed with patient. If an FOBT was given today- please return it to our front desk. If you are over 44 years old - you may need Prevnar 13 or the adult Pneumonia vaccine.  **Flu shots are available--- please call and schedule a FLU-CLINIC appointment**  After your visit with us today you will receive a survey in the mail or online from [employer] regarding your care with us. Please take a moment to fill this out. Your feedback is very important to us as you can help us better understand your patient needs as well as improve your experience and satisfaction. WE CARE ABOUT YOU!!!    

## 2016-07-22 ENCOUNTER — Other Ambulatory Visit: Payer: Self-pay | Admitting: Family Medicine

## 2016-07-28 ENCOUNTER — Other Ambulatory Visit: Payer: Self-pay | Admitting: *Deleted

## 2016-07-28 MED ORDER — AMLODIPINE BESYLATE 5 MG PO TABS: 5.0000 mg | ORAL_TABLET | Freq: Every day | ORAL | 1 refills | Status: DC

## 2016-08-17 ENCOUNTER — Other Ambulatory Visit: Payer: Self-pay | Admitting: Family Medicine

## 2016-08-26 ENCOUNTER — Ambulatory Visit: Payer: BLUE CROSS/BLUE SHIELD | Admitting: Family Medicine

## 2016-12-22 ENCOUNTER — Other Ambulatory Visit: Payer: Self-pay | Admitting: Family Medicine

## 2017-01-22 ENCOUNTER — Other Ambulatory Visit: Payer: Self-pay | Admitting: Family Medicine

## 2017-03-01 ENCOUNTER — Other Ambulatory Visit: Payer: Self-pay | Admitting: Family Medicine

## 2017-03-08 ENCOUNTER — Ambulatory Visit: Payer: BLUE CROSS/BLUE SHIELD | Admitting: Family

## 2017-03-29 ENCOUNTER — Other Ambulatory Visit: Payer: Self-pay | Admitting: Family Medicine

## 2017-04-04 ENCOUNTER — Ambulatory Visit: Payer: BLUE CROSS/BLUE SHIELD | Admitting: Family

## 2017-04-04 ENCOUNTER — Encounter: Payer: Self-pay | Admitting: Family Medicine

## 2017-05-10 ENCOUNTER — Ambulatory Visit: Payer: BLUE CROSS/BLUE SHIELD | Admitting: Family

## 2017-05-10 ENCOUNTER — Other Ambulatory Visit: Payer: Self-pay | Admitting: Family Medicine

## 2017-05-10 ENCOUNTER — Encounter: Payer: Self-pay | Admitting: Family Medicine

## 2017-05-10 ENCOUNTER — Ambulatory Visit (INDEPENDENT_AMBULATORY_CARE_PROVIDER_SITE_OTHER): Payer: BLUE CROSS/BLUE SHIELD | Admitting: Family Medicine

## 2017-05-10 VITALS — BP 138/88 | HR 84 | Temp 99.1°F | Ht 73.0 in | Wt 259.4 lb

## 2017-05-10 DIAGNOSIS — M549 Dorsalgia, unspecified: Secondary | ICD-10-CM | POA: Diagnosis not present

## 2017-05-10 DIAGNOSIS — E785 Hyperlipidemia, unspecified: Secondary | ICD-10-CM

## 2017-05-10 DIAGNOSIS — I10 Essential (primary) hypertension: Secondary | ICD-10-CM

## 2017-05-10 MED ORDER — BENAZEPRIL-HYDROCHLOROTHIAZIDE 10-12.5 MG PO TABS: 1.0000 | ORAL_TABLET | Freq: Every day | ORAL | 3 refills | Status: DC

## 2017-05-10 NOTE — Patient Instructions (Signed)
Great to meet you!  Come back in 6 months unless you need us sooner.    

## 2017-05-10 NOTE — Progress Notes (Signed)
   HPI  Patient presents today here for follow-up of chronic medical conditions.  Patient feels well, he has no complaints.  Hypertension Checking blood pressure at home average 130s over 80s No headache or chest pain.  Hyperlipidemia Good medication compliance with Crestor, nonfasting today. Tolerating medication well.  Musculoskeletal back pain. Good medication compliance of meloxicam, we discussed risk and benefits of chronic use today.   PMH: Smoking status noted ROS: Per HPI  Objective: BP 138/88   Pulse 84   Temp 99.1 F (37.3 C) (Oral)   Ht '6\' 1"'$  (1.854 m)   Wt 259 lb 6.4 oz (117.7 kg)   BMI 34.22 kg/m  Gen: NAD, alert, cooperative with exam HEENT: NCAT CV: RRR, good S1/S2, no murmur Resp: CTABL, no wheezes, non-labored Ext: No edema, warm Neuro: Alert and oriented, No gross deficits  Assessment and plan:  #Hypertension Well-controlled, no changes Refill enalapril/HCTZ Continue Norvasc.  #Hyperlipidemia Nonfasting labs today Continue Crestor, clinically stable  #Musculoskeletal back pain Continue meloxicam, discussed risks and benefits of treatment. He is reasonably low risk for PUD   Orders Placed This Encounter  Procedures  . CMP14+EGFR  . CBC with Differential/Platelet  . Lipid panel  . Bayer DCA Hb A1c Waived    Meds ordered this encounter  Medications  . benazepril-hydrochlorthiazide (LOTENSIN HCT) 10-12.5 MG tablet    Sig: Take 1 tablet by mouth daily.    Dispense:  90 tablet    Refill:  [place], MD [place] 05/10/2017, 12:01 PM

## 2017-05-27 ENCOUNTER — Other Ambulatory Visit: Payer: Self-pay | Admitting: Pediatrics

## 2017-05-28 ENCOUNTER — Other Ambulatory Visit: Payer: Self-pay | Admitting: Pediatrics

## 2017-06-03 ENCOUNTER — Encounter: Payer: Self-pay | Admitting: Pediatrics

## 2017-06-03 ENCOUNTER — Ambulatory Visit (INDEPENDENT_AMBULATORY_CARE_PROVIDER_SITE_OTHER): Payer: BLUE CROSS/BLUE SHIELD | Admitting: Pediatrics

## 2017-06-03 VITALS — BP 131/86 | HR 91 | Temp 97.9°F | Ht 73.0 in | Wt 260.6 lb

## 2017-06-03 DIAGNOSIS — R1033 Periumbilical pain: Secondary | ICD-10-CM

## 2017-06-03 LAB — BAYER DCA HB A1C WAIVED: HB A1C: 4.9 % (ref <7.0)

## 2017-06-03 NOTE — Progress Notes (Signed)
  Subjective:   Patient ID: Orian Figueira, male    DOB: May 28, 1971, 46 y.o.   MRN: [id number] CC: Abdominal Pain  HPI: Keeshawn Fakhouri is a 46 y.o. male presenting for Abdominal Pain  Started a few days ago Not there now Comes and goes Moving in certain ways brings it on such as rolling over in bed or bending over to pick up boxes Dull pain, goes away after a few minutes Not affected by food stooling sometimes every other day Last BM two days ago, normal Doesn't have to strain appetit ehas been good Ate biscuit this morning, ate normally at thanksgiving yesterday No nausea or vomiting No fevers meloxicam on medlist, says he doesn't have any at home  hasnt been taking anything for the pain  Due for blood work  Relevant past medical, surgical, family and social history reviewed. Allergies and medications reviewed and updated. Social History   Tobacco Use  Smoking Status Never Smoker  Smokeless Tobacco Never Used   ROS: Per HPI   Objective:    BP 131/86   Pulse 91   Temp 97.9 F (36.6 C) (Oral)   Ht 6\' 1"  (1.854 m)   Wt 260 lb 9.6 oz (118.2 kg)   BMI 34.38 kg/m   Wt Readings from Last 3 Encounters:  06/03/17 260 lb 9.6 oz (118.2 kg)  05/10/17 259 lb 6.4 oz (117.7 kg)  05/21/16 259 lb (117.5 kg)   Gen: NAD, alert, cooperative with exam, NCAT EYES: EOMI, no conjunctival injection, or no icterus ENT:  OP without erythema LYMPH: no cervical LAD CV: NRRR, normal S1/S2, no murmur, distal pulses 2+ b/l Resp: CTABL, no wheezes, normal WOB Abd: +BS, soft, NTND. no guarding or organomegaly Ext: No edema, warm Neuro: Alert and oriented, strength equal b/l UE and LE, coordination grossly normal MSK: normal muscle bulk  Assessment & Plan:  [place] was seen today for abdominal pain.  Diagnoses and all orders for this visit:  Periumbilical abdominal pain Overdue for blood work on ACE-I, HCTZ No pain now Appetite has been fine, no fevers Normal exam Labs  today Discussed return precautions  Follow up plan: As needed Assunta Found, MD [place]

## 2017-06-04 LAB — CBC WITH DIFFERENTIAL/PLATELET
Basophils Absolute: 0 10*3/uL (ref 0.0–0.2)
Basos: 0 %
EOS (ABSOLUTE): 0.1 10*3/uL (ref 0.0–0.4)
EOS: 2 %
HEMATOCRIT: 43.4 % (ref 37.5–51.0)
HEMOGLOBIN: 14.2 g/dL (ref 13.0–17.7)
IMMATURE GRANS (ABS): 0 10*3/uL (ref 0.0–0.1)
IMMATURE GRANULOCYTES: 0 %
LYMPHS: 26 %
Lymphocytes Absolute: 1.8 10*3/uL (ref 0.7–3.1)
MCH: 30.3 pg (ref 26.6–33.0)
MCHC: 32.7 g/dL (ref 31.5–35.7)
MCV: 93 fL (ref 79–97)
MONOS ABS: 0.5 10*3/uL (ref 0.1–0.9)
Monocytes: 8 %
NEUTROS PCT: 64 %
Neutrophils Absolute: 4.6 10*3/uL (ref 1.4–7.0)
Platelets: 329 10*3/uL (ref 150–379)
RBC: 4.69 x10E6/uL (ref 4.14–5.80)
RDW: 13 % (ref 12.3–15.4)
WBC: 7.1 10*3/uL (ref 3.4–10.8)

## 2017-06-04 LAB — CMP14+EGFR
ALK PHOS: 84 IU/L (ref 39–117)
ALT: 20 IU/L (ref 0–44)
AST: 21 IU/L (ref 0–40)
Albumin/Globulin Ratio: 1.8 (ref 1.2–2.2)
Albumin: 4.4 g/dL (ref 3.5–5.5)
BUN/Creatinine Ratio: 11 (ref 9–20)
BUN: 10 mg/dL (ref 6–24)
Bilirubin Total: 0.3 mg/dL (ref 0.0–1.2)
CALCIUM: 9.5 mg/dL (ref 8.7–10.2)
CO2: 24 mmol/L (ref 20–29)
CREATININE: 0.91 mg/dL (ref 0.76–1.27)
Chloride: 101 mmol/L (ref 96–106)
GFR calc Af Amer: 117 mL/min/{1.73_m2} (ref >59)
GFR calc non Af Amer: 101 mL/min/{1.73_m2} (ref >59)
GLOBULIN, TOTAL: 2.5 g/dL (ref 1.5–4.5)
GLUCOSE: 89 mg/dL (ref 65–99)
Potassium: 3.9 mmol/L (ref 3.5–5.2)
SODIUM: 139 mmol/L (ref 134–144)
Total Protein: 6.9 g/dL (ref 6.0–8.5)

## 2017-06-04 LAB — LIPID PANEL
CHOL/HDL RATIO: 2.9 ratio (ref 0.0–5.0)
Cholesterol, Total: 104 mg/dL (ref 100–199)
HDL: 36 mg/dL — AB (ref >39)
LDL Calculated: 55 mg/dL (ref 0–99)
TRIGLYCERIDES: 64 mg/dL (ref 0–149)
VLDL Cholesterol Cal: 13 mg/dL (ref 5–40)

## 2017-06-06 ENCOUNTER — Encounter: Payer: Self-pay | Admitting: Family Medicine

## 2017-08-05 ENCOUNTER — Ambulatory Visit: Payer: BLUE CROSS/BLUE SHIELD | Admitting: Nurse Practitioner

## 2017-08-11 ENCOUNTER — Encounter: Payer: Self-pay | Admitting: Family Medicine

## 2017-11-08 ENCOUNTER — Ambulatory Visit: Payer: BLUE CROSS/BLUE SHIELD | Admitting: Family Medicine

## 2017-12-13 ENCOUNTER — Ambulatory Visit: Payer: BLUE CROSS/BLUE SHIELD | Admitting: Family Medicine

## 2017-12-26 ENCOUNTER — Other Ambulatory Visit: Payer: Self-pay | Admitting: Family Medicine

## 2017-12-26 ENCOUNTER — Ambulatory Visit (INDEPENDENT_AMBULATORY_CARE_PROVIDER_SITE_OTHER): Payer: BLUE CROSS/BLUE SHIELD | Admitting: Family Medicine

## 2017-12-26 ENCOUNTER — Encounter: Payer: Self-pay | Admitting: Family Medicine

## 2017-12-26 VITALS — BP 131/85 | HR 85 | Temp 97.0°F | Ht 73.0 in | Wt 262.0 lb

## 2017-12-26 DIAGNOSIS — B353 Tinea pedis: Secondary | ICD-10-CM | POA: Diagnosis not present

## 2017-12-26 DIAGNOSIS — N529 Male erectile dysfunction, unspecified: Secondary | ICD-10-CM | POA: Diagnosis not present

## 2017-12-26 MED ORDER — TADALAFIL 20 MG PO TABS: 10.0000 mg | ORAL_TABLET | Freq: Every day | ORAL | 3 refills | Status: DC | PRN

## 2017-12-26 MED ORDER — TERBINAFINE HCL 250 MG PO TABS: 250.0000 mg | ORAL_TABLET | Freq: Every day | ORAL | 0 refills | Status: DC

## 2017-12-26 MED ORDER — AMLODIPINE BESYLATE 5 MG PO TABS: 5.0000 mg | ORAL_TABLET | Freq: Every day | ORAL | 1 refills | Status: DC

## 2017-12-26 NOTE — Patient Instructions (Signed)
Great to see you!  Try terbinifine for 2 weeks, also apply compound w twice daily for 2 weeks

## 2017-12-26 NOTE — Progress Notes (Signed)
   HPI  Patient presents today here for lesion of the right foot as well as ED.  ED, several months onset of difficulty keeping an erection Would like to try medication.  Patient also has a lesion between the fourth and fifth toe on the right foot, he states that it improves with foot soaks, also notes improved after he started using antifungal spray.   PMH: Smoking status noted ROS: Per HPI  Objective: BP 131/85 (BP Location: Right Arm, Patient Position: Sitting, Cuff Size: Large)   Pulse 85   Temp (!) 97 F (36.1 C) (Oral)   Ht 6\' 1"  (1.854 m)   Wt 262 lb (118.8 kg)   BMI 34.57 kg/m  Gen: NAD, alert, cooperative with exam HEENT: NCAT CV: RRR, good S1/S2, no murmur Resp: CTABL, no wheezes, non-labored Ext: No edema, warm Neuro: Alert and oriented, No gross deficits Skin Right foot with macerated area between fourth and fifth digit in the fourth webspace of the right foot, thick callus was removed gently with a wooden cotton swab without pain  Assessment and plan:  #Tinea pedis right foot New problem Start terbinafine x2 weeks, also apply Compound W to the thick callus to help remove it. Call back in 2 weeks if not improving Could consider ketoconazole cream to the area and placing a piece of cotton ball to help keep it dry  #ED - new problem Trial of Cialis, if not covered well by insurance will discuss generic sildenafil  Meds ordered this encounter  Medications  . amLODipine (NORVASC) 5 MG tablet    Sig: Take 1 tablet (5 mg total) by mouth daily.    Dispense:  90 tablet    Refill:  1  . terbinafine (LAMISIL) 250 MG tablet    Sig: Take 1 tablet (250 mg total) by mouth daily.    Dispense:  14 tablet    Refill:  0  . tadalafil (CIALIS) 20 MG tablet    Sig: Take 0.5-1 tablets (10-20 mg total) by mouth daily as needed for erectile dysfunction.    Dispense:  10 tablet    Refill:  [place], MD [place] 12/26/2017, 4:00  PM

## 2017-12-27 ENCOUNTER — Telehealth: Payer: Self-pay | Admitting: Family Medicine

## 2017-12-27 NOTE — Telephone Encounter (Signed)
Patient calling to confirm that the Terbanifine is for his right foot instead of anything else.  Informed patient it is for the right foot and he is to take daily x 2 weeks.

## 2018-01-18 ENCOUNTER — Encounter: Payer: Self-pay | Admitting: Podiatry

## 2018-01-18 ENCOUNTER — Ambulatory Visit (INDEPENDENT_AMBULATORY_CARE_PROVIDER_SITE_OTHER): Payer: BLUE CROSS/BLUE SHIELD

## 2018-01-18 ENCOUNTER — Ambulatory Visit (INDEPENDENT_AMBULATORY_CARE_PROVIDER_SITE_OTHER): Payer: BLUE CROSS/BLUE SHIELD | Admitting: Podiatry

## 2018-01-18 DIAGNOSIS — L97509 Non-pressure chronic ulcer of other part of unspecified foot with unspecified severity: Secondary | ICD-10-CM

## 2018-01-18 DIAGNOSIS — Z79899 Other long term (current) drug therapy: Secondary | ICD-10-CM | POA: Diagnosis not present

## 2018-01-18 DIAGNOSIS — L97529 Non-pressure chronic ulcer of other part of left foot with unspecified severity: Secondary | ICD-10-CM | POA: Diagnosis not present

## 2018-01-18 DIAGNOSIS — L97519 Non-pressure chronic ulcer of other part of right foot with unspecified severity: Secondary | ICD-10-CM | POA: Diagnosis not present

## 2018-01-18 DIAGNOSIS — B353 Tinea pedis: Secondary | ICD-10-CM | POA: Diagnosis not present

## 2018-01-18 DIAGNOSIS — B351 Tinea unguium: Secondary | ICD-10-CM

## 2018-01-18 DIAGNOSIS — L988 Other specified disorders of the skin and subcutaneous tissue: Secondary | ICD-10-CM | POA: Diagnosis not present

## 2018-01-18 MED ORDER — NAFTIFINE HCL 2 % EX GEL: 1.0000 "application " | Freq: Every day | CUTANEOUS | 0 refills | Status: DC

## 2018-01-18 MED ORDER — TERBINAFINE HCL 250 MG PO TABS: 250.0000 mg | ORAL_TABLET | Freq: Every day | ORAL | 0 refills | Status: DC

## 2018-01-18 NOTE — Progress Notes (Signed)
Subjective:    Patient ID: Logan Smith, male    DOB: 09/25/1970, 47 y.o.   MRN: [id number]  HPI 47 year old male presents the office today for concerns of some skin issues in between his fourth and fifth toe on the right foot.  Is been ongoing for 1 month.  He states he wants to do Epsom salts which does help.  He denies any drainage or pus discomfort.  Denies any swelling or redness.  His primary care physician did prescribe 30 days of Lamisil.  He has not noticed much change with this medication at this point.  He also states his nails are discolored.  He has no other concerns.   Review of Systems  All other systems reviewed and are negative.  Past Medical History:  Diagnosis Date  . Back pain   . Erectile dysfunction   . Hyperlipidemia   . Hypertension     Past Surgical History:  Procedure Laterality Date  . HEMORROIDECTOMY    . VASECTOMY       Current Outpatient Medications:  .  amLODipine (NORVASC) 5 MG tablet, Take 1 tablet (5 mg total) by mouth daily., Disp: 90 tablet, Rfl: 1 .  benazepril-hydrochlorthiazide (LOTENSIN HCT) 10-12.5 MG tablet, Take 1 tablet by mouth daily., Disp: 90 tablet, Rfl: 3 .  fish oil-omega-3 fatty acids 1000 MG capsule, Take 2 g by mouth daily., Disp: , Rfl:  .  fluticasone (FLONASE) 50 MCG/ACT nasal spray, Place 2 sprays into both nostrils daily., Disp: 16 g, Rfl: 6 .  Multiple Vitamins-Minerals (MENS 50+ MULTI VITAMIN/MIN PO), Take by mouth., Disp: , Rfl:  .  Naftifine HCl (NAFTIN) 2 % GEL, Apply 1 application topically daily., Disp: 1 Tube, Rfl: 0 .  rosuvastatin (CRESTOR) 10 MG tablet, TAKE 1 TABLET BY MOUTH EVERY DAY, Disp: 90 tablet, Rfl: 1 .  tadalafil (CIALIS) 20 MG tablet, Take 0.5-1 tablets (10-20 mg total) by mouth daily as needed for erectile dysfunction. (Patient not taking: Reported on 01/18/2018), Disp: 10 tablet, Rfl: 3 .  terbinafine (LAMISIL) 250 MG tablet, Take 1 tablet (250 mg total) by mouth daily., Disp: 14 tablet, Rfl: 0 .   terbinafine (LAMISIL) 250 MG tablet, Take 1 tablet (250 mg total) by mouth daily., Disp: 60 tablet, Rfl: 0  No Known Allergies       Objective:   Physical Exam  General: AAO x3, NAD  Dermatological: All the interspace in the right foot between the fourth and fifth toes in the sulcus there is macerated tissue as well as thick hyperkeratotic tissue present.  Is able to debride quite a bit of tissue today and there is no underlying ulceration is no drainage or pus identified from the area.  There is some dry skin present to the plantar aspect of the foot as well.  The nails themselves are hypertrophic, dystrophic with ill-defined discoloration.  No open lesion identified otherwise.  Vascular: Dorsalis Pedis artery and Posterior Tibial artery pedal pulses are 2/4 bilateral with immedate capillary fill time. There is no pain with calf compression, swelling, warmth, erythema.   Neruologic: Grossly intact via light touch bilateral.  Sensation intact with Derrel Nip monofilament.  Musculoskeletal: No pain, crepitus, or limitation noted with foot and ankle range of motion bilateral. Muscular strength 5/5 in all groups tested bilateral.  Gait: Unassisted, Nonantalgic.     Assessment & Plan:  47 year old male with onychomycosis, tinea pedis; macerated tissue interspace -Treatment options discussed including all alternatives, risks, and complications -Etiology of symptoms were  discussed -I debrided the hyperkeratotic, macerated tissue of the fourth interspace without any complications of the right foot.  I will make sure he tries thoroughly between his toes.  Also described Naftin gel.  We discussed treatment options for the nail fungus as well.  He just recently completed 30 days of Lamisil.  We discussed continue this for total of 90 days and went ahead and filled 60 more days for this but I wanted to give LFT and CBC prior to starting this.  Blood work was prescribed.  I will call him with the  results before he starts the medication he understands this.  Trula Slade DPM

## 2018-01-18 NOTE — Patient Instructions (Signed)

## 2018-02-14 ENCOUNTER — Telehealth: Payer: Self-pay | Admitting: Podiatry

## 2018-02-14 NOTE — Telephone Encounter (Signed)
Left message requesting a call back to discuss his medication and cost.

## 2018-02-14 NOTE — Telephone Encounter (Signed)
I have a question about my medication. When I went to the pharmacy to get it this time it was higher than the first time. If you could call me back please at [phone].

## 2018-02-16 ENCOUNTER — Ambulatory Visit: Payer: BLUE CROSS/BLUE SHIELD | Admitting: Podiatry

## 2018-03-07 ENCOUNTER — Ambulatory Visit (INDEPENDENT_AMBULATORY_CARE_PROVIDER_SITE_OTHER): Payer: BLUE CROSS/BLUE SHIELD | Admitting: Podiatry

## 2018-03-07 ENCOUNTER — Encounter: Payer: Self-pay | Admitting: Podiatry

## 2018-03-07 ENCOUNTER — Ambulatory Visit (INDEPENDENT_AMBULATORY_CARE_PROVIDER_SITE_OTHER): Payer: BLUE CROSS/BLUE SHIELD

## 2018-03-07 DIAGNOSIS — M2041 Other hammer toe(s) (acquired), right foot: Secondary | ICD-10-CM

## 2018-03-07 DIAGNOSIS — B351 Tinea unguium: Secondary | ICD-10-CM | POA: Diagnosis not present

## 2018-03-07 DIAGNOSIS — Z79899 Other long term (current) drug therapy: Secondary | ICD-10-CM | POA: Diagnosis not present

## 2018-03-08 ENCOUNTER — Telehealth: Payer: Self-pay | Admitting: Podiatry

## 2018-03-08 ENCOUNTER — Other Ambulatory Visit: Payer: Self-pay | Admitting: Podiatry

## 2018-03-08 MED ORDER — NAFTIFINE HCL 2 % EX GEL: 1.0000 "application " | Freq: Every day | CUTANEOUS | 0 refills | Status: DC

## 2018-03-08 NOTE — Telephone Encounter (Signed)
Pt was seen in office yesterday 03/08/2018 and needed a refill on his topical cream for his feet.

## 2018-03-08 NOTE — Addendum Note (Signed)
Addended by: Harriett Sine D on: 03/08/2018 12:07 PM   Modules accepted: Orders

## 2018-03-08 NOTE — Telephone Encounter (Signed)
Orders for generic naftin escribed to [facility].

## 2018-03-11 NOTE — Progress Notes (Signed)
Subjective: Logan Smith presents the office today for follow-up evaluation of onychomycosis as well as for skin ulceration, skin lesion of interest fourth and fifth toes of the right foot.  He states he is doing well has had no complications of Lamisil.  The area of skin between the fourth and fifth toes has recurred which causes some occasional discomfort.  No other concerns. Denies any systemic complaints such as fevers, chills, nausea, vomiting. No acute changes since last appointment, and no other complaints at this time.   Objective: AAO x3, NAD DP/PT pulses palpable bilaterally, CRT less than 3 seconds Overall skin looks much better except from the fourth interspace of the right foot there is still  hyperkeratotic, macerated tissue present.  There is no drainage or pus upon debridement there is no underlying ulceration, drainage or any signs of infection noted today.  Nails appear to have some proximal clearing. No open lesions or pre-ulcerative lesions.  No pain with calf compression, swelling, warmth, erythema  Assessment: Skin lesion right fourth interspace, onychomycosis  Plan: -All treatment options discussed with the patient including all alternatives, risks, complications.  -X-rays were obtained reviewed.  Adductovarus is present there is no evidence of acute fracture identified there is no significant bone spurs present. -I debrided the hyperkeratotic, macerated tissue in the right fourth interspace without any comp occasions or bleeding today.  Ultimately we discussed possible surgical intervention to help straighten out the toes or possible sacralization between the fourth and fifth toes. -We will continue Lamisil.  He is tolerating well.  We will recheck CBC and LFT. -Patient encouraged to call the office with any questions, concerns, change in symptoms.   Trula Slade DPM

## 2018-03-15 ENCOUNTER — Other Ambulatory Visit: Payer: Self-pay | Admitting: Podiatry

## 2018-04-18 ENCOUNTER — Ambulatory Visit: Payer: BLUE CROSS/BLUE SHIELD | Admitting: Podiatry

## 2018-04-28 ENCOUNTER — Ambulatory Visit: Payer: BLUE CROSS/BLUE SHIELD | Admitting: Podiatry

## 2018-05-10 ENCOUNTER — Ambulatory Visit (INDEPENDENT_AMBULATORY_CARE_PROVIDER_SITE_OTHER): Payer: BLUE CROSS/BLUE SHIELD | Admitting: Family Medicine

## 2018-05-10 ENCOUNTER — Encounter: Payer: Self-pay | Admitting: Family Medicine

## 2018-05-10 VITALS — BP 139/79 | HR 81 | Temp 97.6°F | Ht 73.0 in | Wt 271.0 lb

## 2018-05-10 DIAGNOSIS — E669 Obesity, unspecified: Secondary | ICD-10-CM | POA: Diagnosis not present

## 2018-05-10 DIAGNOSIS — R0683 Snoring: Secondary | ICD-10-CM | POA: Diagnosis not present

## 2018-05-10 DIAGNOSIS — I10 Essential (primary) hypertension: Secondary | ICD-10-CM | POA: Diagnosis not present

## 2018-05-10 DIAGNOSIS — E782 Mixed hyperlipidemia: Secondary | ICD-10-CM | POA: Diagnosis not present

## 2018-05-10 MED ORDER — ROSUVASTATIN CALCIUM 10 MG PO TABS: 10.0000 mg | ORAL_TABLET | Freq: Every day | ORAL | 1 refills | Status: DC

## 2018-05-10 MED ORDER — BENAZEPRIL-HYDROCHLOROTHIAZIDE 10-12.5 MG PO TABS: 1.0000 | ORAL_TABLET | Freq: Every day | ORAL | 3 refills | Status: DC

## 2018-05-10 NOTE — Progress Notes (Signed)
Subjective:    Patient ID: Logan Smith, male    DOB: [birth year], 47 y.o.   MRN: [id number]  Chief Complaint:  Excessive snoring (wants referral to ENT instead of sleep study)   HPI: Logan Smith is a 47 y.o. male presenting on 05/10/2018 for Excessive snoring (wants referral to ENT instead of sleep study)   1. Snoring  Pt presents today for complaints of snoring loudly. States this has been ongoing for several months to years. States his significant other complains daily. He reports a dry mouth upon waking, he states he does fall asleep watching TV in the evenings. States he does have some daytime fatigue, but not often. Denies decreased concentration or headaches. Pt states he would like a referral to ENT. Denies sore throat, fever, or chills.    2. Essential hypertension  Complaint with meds - Yes Checking BP at home: no Exercising Regularly - No Watching Salt intake - No Pertinent ROS:  Headache - No Chest pain - No Dyspnea - No Palpitations - No LE edema - No  They report good compliance with medications and can restate their regimen by memory. No medication side effects   3. Mixed hyperlipidemia  Compliant with medications, denies side effects. Does not watch diet, does not exercise.   4. Obesity (BMI 30-39.9)  Does not watch diet, does not exercise.      Relevant past medical, surgical, family, and social history reviewed and updated as indicated.  Allergies and medications reviewed and updated.   Past Medical History:  Diagnosis Date  . Back pain   . Erectile dysfunction   . Hyperlipidemia   . Hypertension     Past Surgical History:  Procedure Laterality Date  . HEMORROIDECTOMY    . VASECTOMY      Social History   Socioeconomic History  . Marital status: Single    Spouse name: Not on file  . Number of children: Not on file  . Years of education: Not on file  . Highest education level: Not on file  Occupational History  . Not on file    Social Needs  . Financial resource strain: Not on file  . Food insecurity:    Worry: Not on file    Inability: Not on file  . Transportation needs:    Medical: Not on file    Non-medical: Not on file  Tobacco Use  . Smoking status: Never Smoker  . Smokeless tobacco: Never Used  Substance and Sexual Activity  . Alcohol use: No    Comment: rarely  . Drug use: No  . Sexual activity: Not on file  Lifestyle  . Physical activity:    Days per week: Not on file    Minutes per session: Not on file  . Stress: Not on file  Relationships  . Social connections:    Talks on phone: Not on file    Gets together: Not on file    Attends religious service: Not on file    Active member of club or organization: Not on file    Attends meetings of clubs or organizations: Not on file    Relationship status: Not on file  . Intimate partner violence:    Fear of current or ex partner: Not on file    Emotionally abused: Not on file    Physically abused: Not on file    Forced sexual activity: Not on file  Other Topics Concern  . Not on file  Social History Narrative  .  Not on file    Outpatient Encounter Medications as of 05/10/2018  Medication Sig  . amLODipine (NORVASC) 5 MG tablet Take 1 tablet (5 mg total) by mouth daily.  . benazepril-hydrochlorthiazide (LOTENSIN HCT) 10-12.5 MG tablet Take 1 tablet by mouth daily.  . fish oil-omega-3 fatty acids 1000 MG capsule Take 2 g by mouth daily.  . fluticasone (FLONASE) 50 MCG/ACT nasal spray Place 2 sprays into both nostrils daily.  . Multiple Vitamins-Minerals (MENS 50+ MULTI VITAMIN/MIN PO) Take by mouth.  . Naftifine HCl (NAFTIN) 2 % GEL Apply 1 application topically daily.  . rosuvastatin (CRESTOR) 10 MG tablet Take 1 tablet (10 mg total) by mouth daily.  . tadalafil (CIALIS) 20 MG tablet Take 0.5-1 tablets (10-20 mg total) by mouth daily as needed for erectile dysfunction.  . terbinafine (LAMISIL) 250 MG tablet Take 1 tablet (250 mg total)  by mouth daily.  . [DISCONTINUED] benazepril-hydrochlorthiazide (LOTENSIN HCT) 10-12.5 MG tablet Take 1 tablet by mouth daily.  . [DISCONTINUED] rosuvastatin (CRESTOR) 10 MG tablet TAKE 1 TABLET BY MOUTH EVERY DAY  . [DISCONTINUED] terbinafine (LAMISIL) 250 MG tablet TAKE 1 TABLET BY MOUTH EVERY DAY   No facility-administered encounter medications on file as of 05/10/2018.     No Known Allergies  Review of Systems  Constitutional: Positive for fatigue. Negative for chills and fever.  HENT: Negative for congestion, sore throat, trouble swallowing and voice change.   Eyes: Negative for photophobia and visual disturbance.  Respiratory: Negative for cough, chest tightness and shortness of breath.   Cardiovascular: Negative for chest pain, palpitations and leg swelling.  Gastrointestinal: Negative for abdominal pain, constipation, diarrhea, nausea and vomiting.  Genitourinary: Negative for decreased urine volume and difficulty urinating.  Musculoskeletal: Negative for arthralgias and myalgias.  Neurological: Negative for dizziness, weakness, light-headedness, numbness and headaches.  Psychiatric/Behavioral: Negative for confusion and sleep disturbance.  All other systems reviewed and are negative.       Objective:    BP 139/79   Pulse 81   Temp 97.6 F (36.4 C) (Oral)   Ht _0  (1.854 m)   Wt 271 lb (122.9 kg)   BMI 35.75 kg/m    Wt Readings from Last 3 Encounters:  05/10/18 271 lb (122.9 kg)  12/26/17 262 lb (118.8 kg)  06/03/17 260 lb 9.6 oz (118.2 kg)    Physical Exam  Constitutional: He is oriented to person, place, and time. He appears well-developed and well-nourished. He is cooperative. No distress.  HENT:  Head: Normocephalic and atraumatic.  Right Ear: Hearing, tympanic membrane, external ear and ear canal normal.  Left Ear: Hearing, tympanic membrane, external ear and ear canal normal.  Nose: Nose normal. No mucosal edema, rhinorrhea or septal deviation.   Mouth/Throat: Uvula is midline, oropharynx is clear and moist and mucous membranes are normal. Tonsils are 2+ on the right. Tonsils are 2+ on the left. No tonsillar exudate.  Eyes: Pupils are equal, round, and reactive to light. Conjunctivae, EOM and lids are normal.  Neck: Trachea normal, normal range of motion and phonation normal. Neck supple. No JVD present. Carotid bruit is not present.  Cardiovascular: Normal rate, regular rhythm and normal heart sounds. PMI is not displaced. Exam reveals no gallop and no friction rub.  No murmur heard. Pulmonary/Chest: Effort normal and breath sounds normal. No respiratory distress.  Abdominal: Soft. Bowel sounds are normal. He exhibits no mass. There is no tenderness.  Musculoskeletal: Normal range of motion. He exhibits no edema.  Lymphadenopathy:  He has no cervical adenopathy.  Neurological: He is alert and oriented to person, place, and time. He has normal strength. He displays normal reflexes. No cranial nerve deficit or sensory deficit. Coordination normal.  Skin: Skin is warm and dry. Capillary refill takes less than 2 seconds.  Psychiatric: He has a normal mood and affect. His speech is normal and behavior is normal. Judgment and thought content normal. Cognition and memory are normal.  Nursing note and vitals reviewed.   Results for orders placed or performed in visit on 05/10/17  CMP14+EGFR  Result Value Ref Range   Glucose 89 65 - 99 mg/dL   BUN 10 6 - 24 mg/dL   Creatinine, Ser 0.91 0.76 - 1.27 mg/dL   GFR calc non Af Amer 101 >59 mL/min/1.73   GFR calc Af Amer 117 >59 mL/min/1.73   BUN/Creatinine Ratio 11 9 - 20   Sodium 139 134 - 144 mmol/L   Potassium 3.9 3.5 - 5.2 mmol/L   Chloride 101 96 - 106 mmol/L   CO2 24 20 - 29 mmol/L   Calcium 9.5 8.7 - 10.2 mg/dL   Total Protein 6.9 6.0 - 8.5 g/dL   Albumin 4.4 3.5 - 5.5 g/dL   Globulin, Total 2.5 1.5 - 4.5 g/dL   Albumin/Globulin Ratio 1.8 1.2 - 2.2   Bilirubin Total 0.3 0.0 -  1.2 mg/dL   Alkaline Phosphatase 84 39 - 117 IU/L   AST 21 0 - 40 IU/L   ALT 20 0 - 44 IU/L  CBC with Differential/Platelet  Result Value Ref Range   WBC 7.1 3.4 - 10.8 x10E3/uL   RBC 4.69 4.14 - 5.80 x10E6/uL   Hemoglobin 14.2 13.0 - 17.7 g/dL   Hematocrit 43.4 37.5 - 51.0 %   MCV 93 79 - 97 fL   MCH 30.3 26.6 - 33.0 pg   MCHC 32.7 31.5 - 35.7 g/dL   RDW 13.0 12.3 - 15.4 %   Platelets 329 150 - 379 x10E3/uL   Neutrophils 64 Not Estab. %   Lymphs 26 Not Estab. %   Monocytes 8 Not Estab. %   Eos 2 Not Estab. %   Basos 0 Not Estab. %   Neutrophils Absolute 4.6 1.4 - 7.0 x10E3/uL   Lymphocytes Absolute 1.8 0.7 - 3.1 x10E3/uL   Monocytes Absolute 0.5 0.1 - 0.9 x10E3/uL   EOS (ABSOLUTE) 0.1 0.0 - 0.4 x10E3/uL   Basophils Absolute 0.0 0.0 - 0.2 x10E3/uL   Immature Granulocytes 0 Not Estab. %   Immature Grans (Abs) 0.0 0.0 - 0.1 x10E3/uL  Lipid panel  Result Value Ref Range   Cholesterol, Total 104 100 - 199 mg/dL   Triglycerides 64 0 - 149 mg/dL   HDL 36 (L) >39 mg/dL   VLDL Cholesterol Cal 13 5 - 40 mg/dL   LDL Calculated 55 0 - 99 mg/dL   Chol/HDL Ratio 2.9 0.0 - 5.0 ratio  Bayer DCA Hb A1c Waived  Result Value Ref Range   HB A1C (BAYER DCA - WAIVED) 4.9 <7.0 %       Pertinent labs & imaging results that were available during my care of the patient were reviewed by me and considered in my medical decision making.  Assessment & Plan:  Travares was seen today for excessive snoring.  Diagnoses and all orders for this visit:  Snoring Symptoms concerning for sleep apnea. Sleep study needed. Pt would also like a referral to ENT. -     Ambulatory referral to Neurology -  Ambulatory referral to ENT  Essential hypertension  Diet and exercise encouraged. Continue medications as prescribed.  -     CMP14+EGFR -     CBC with Differential/Platelet -     Lipid panel -     TSH -     Microalbumin / creatinine urine ratio -     benazepril-hydrochlorthiazide (LOTENSIN HCT)  10-12.5 MG tablet; Take 1 tablet by mouth daily.  Mixed hyperlipidemia Diet and exercise encouraged. Continue medications as prescribed.  -     rosuvastatin (CRESTOR) 10 MG tablet; Take 1 tablet (10 mg total) by mouth daily.  Obesity (BMI 30-39.9) Diet and exercise encouraged.  -     Ambulatory referral to Neurology     Continue all other maintenance medications.  Follow up plan: Return in about 6 months (around 11/09/2018), or if symptoms worsen or fail to improve.  Educational handout given for sleep apnea   The above assessment and management plan was discussed with the patient. The patient verbalized understanding of and has agreed to the management plan. Patient is aware to call the clinic if symptoms persist or worsen. Patient is aware when to return to the clinic for a follow-up visit. Patient educated on when it is appropriate to go to the emergency department.   Monia Pouch, FNP-C [facility] [phone]

## 2018-05-10 NOTE — Patient Instructions (Signed)

## 2018-05-11 LAB — CBC WITH DIFFERENTIAL/PLATELET
Basophils Absolute: 0 10*3/uL (ref 0.0–0.2)
Basos: 0 %
EOS (ABSOLUTE): 0.2 10*3/uL (ref 0.0–0.4)
EOS: 2 %
HEMATOCRIT: 41.8 % (ref 37.5–51.0)
Hemoglobin: 13.7 g/dL (ref 13.0–17.7)
IMMATURE GRANULOCYTES: 0 %
Immature Grans (Abs): 0 10*3/uL (ref 0.0–0.1)
Lymphocytes Absolute: 1.8 10*3/uL (ref 0.7–3.1)
Lymphs: 23 %
MCH: 30.4 pg (ref 26.6–33.0)
MCHC: 32.8 g/dL (ref 31.5–35.7)
MCV: 93 fL (ref 79–97)
MONOS ABS: 0.6 10*3/uL (ref 0.1–0.9)
Monocytes: 8 %
NEUTROS PCT: 67 %
Neutrophils Absolute: 5.1 10*3/uL (ref 1.4–7.0)
PLATELETS: 326 10*3/uL (ref 150–450)
RBC: 4.5 x10E6/uL (ref 4.14–5.80)
RDW: 11.8 % — AB (ref 12.3–15.4)
WBC: 7.7 10*3/uL (ref 3.4–10.8)

## 2018-05-11 LAB — CMP14+EGFR
ALK PHOS: 79 IU/L (ref 39–117)
ALT: 19 IU/L (ref 0–44)
AST: 21 IU/L (ref 0–40)
Albumin/Globulin Ratio: 1.7 (ref 1.2–2.2)
Albumin: 4.3 g/dL (ref 3.5–5.5)
BUN/Creatinine Ratio: 12 (ref 9–20)
BUN: 10 mg/dL (ref 6–24)
Bilirubin Total: 0.3 mg/dL (ref 0.0–1.2)
CHLORIDE: 102 mmol/L (ref 96–106)
CO2: 24 mmol/L (ref 20–29)
CREATININE: 0.84 mg/dL (ref 0.76–1.27)
Calcium: 9.5 mg/dL (ref 8.7–10.2)
GFR calc Af Amer: 121 mL/min/{1.73_m2} (ref >59)
GFR calc non Af Amer: 105 mL/min/{1.73_m2} (ref >59)
GLOBULIN, TOTAL: 2.5 g/dL (ref 1.5–4.5)
GLUCOSE: 92 mg/dL (ref 65–99)
Potassium: 3.7 mmol/L (ref 3.5–5.2)
SODIUM: 144 mmol/L (ref 134–144)
Total Protein: 6.8 g/dL (ref 6.0–8.5)

## 2018-05-11 LAB — MICROALBUMIN / CREATININE URINE RATIO
CREATININE, UR: 373.5 mg/dL
MICROALBUM., U, RANDOM: 21.3 ug/mL
Microalb/Creat Ratio: 5.7 mg/g creat (ref 0.0–30.0)

## 2018-05-11 LAB — TSH: TSH: 1.69 u[IU]/mL (ref 0.450–4.500)

## 2018-05-11 LAB — LIPID PANEL
CHOL/HDL RATIO: 2.7 ratio (ref 0.0–5.0)
CHOLESTEROL TOTAL: 94 mg/dL — AB (ref 100–199)
HDL: 35 mg/dL — AB (ref >39)
LDL Calculated: 43 mg/dL (ref 0–99)
Triglycerides: 82 mg/dL (ref 0–149)
VLDL Cholesterol Cal: 16 mg/dL (ref 5–40)

## 2018-05-17 ENCOUNTER — Encounter: Payer: Self-pay | Admitting: Neurology

## 2018-05-17 ENCOUNTER — Ambulatory Visit (INDEPENDENT_AMBULATORY_CARE_PROVIDER_SITE_OTHER): Payer: BLUE CROSS/BLUE SHIELD | Admitting: Neurology

## 2018-05-17 VITALS — BP 150/90 | HR 87 | Ht 72.0 in | Wt 265.0 lb

## 2018-05-17 DIAGNOSIS — E669 Obesity, unspecified: Secondary | ICD-10-CM

## 2018-05-17 DIAGNOSIS — R0681 Apnea, not elsewhere classified: Secondary | ICD-10-CM

## 2018-05-17 DIAGNOSIS — Z9189 Other specified personal risk factors, not elsewhere classified: Secondary | ICD-10-CM | POA: Diagnosis not present

## 2018-05-17 DIAGNOSIS — R351 Nocturia: Secondary | ICD-10-CM

## 2018-05-17 DIAGNOSIS — R0683 Snoring: Secondary | ICD-10-CM | POA: Diagnosis not present

## 2018-05-17 NOTE — Patient Instructions (Signed)

## 2018-05-17 NOTE — Progress Notes (Signed)
Subjective:    Patient ID: Logan Smith is a 47 y.o. male.  HPI     Star Age, MD, PhD [facility] [address] P.O. [place], [place] [address]  Dear Logan Smith,   I saw your patient, Logan Smith, upon your kind request, in my clinic today for initial consultation of his sleep disorder, in particular, concern for underlying obstructive sleep apnea. The patient is unaccompanied today. As you know, Logan Smith is a 47 year old right-handed gentleman with an underlying medical history of hypertension, hyperlipidemia, back pain and obesity, who reports snoring and sleep disruption. I reviewed your office note from 05/10/2018. He has also been referred to ENT. His Epworth sleepiness score is 4 out of 24 today, fatigue score is 12 out of 63. He is single, has 2 kids, ages 43 and 72. 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 He also works part-time for [employer] till about 10 PM each day. Bedtime is around 11 and rise time around 6. He has nocturia about once per hour tonight. He lives with his girlfriend, who has complained about his loud snoring and previously he was told that he has pauses in his breathing. He does not wake up gasping for air and denies any morning headaches. He denies restless leg symptoms or a family history of sleep apnea. He is a nonsmoker and drinks alcohol occasionally, caffeine in the form of soda, one bottle per day on average. He is trying to lose weight. He has lost a little bit of weight.   His Past Medical History Is Significant For: Past Medical History:  Diagnosis Date  . Back pain   . Erectile dysfunction   . Hyperlipidemia   . Hypertension     His Past Surgical History Is Significant For: Past Surgical History:  Procedure Laterality Date  . HEMORROIDECTOMY    . VASECTOMY      His Family History Is Significant For: Family History  Problem Relation Age of Onset  . Diabetes  Mother   . Hypertension Mother   . Hypertension Father     His Social History Is Significant For: Social History   Socioeconomic History  . Marital status: Single    Spouse name: Not on file  . Number of children: Not on file  . Years of education: Not on file  . Highest education level: Not on file  Occupational History  . Not on file  Social Needs  . Financial resource strain: Not on file  . Food insecurity:    Worry: Not on file    Inability: Not on file  . Transportation needs:    Medical: Not on file    Non-medical: Not on file  Tobacco Use  . Smoking status: Never Smoker  . Smokeless tobacco: Never Used  Substance and Sexual Activity  . Alcohol use: No    Comment: rarely  . Drug use: No  . Sexual activity: Not on file  Lifestyle  . Physical activity:    Days per week: Not on file    Minutes per session: Not on file  . Stress: Not on file  Relationships  . Social connections:    Talks on phone: Not on file    Gets together: Not on file    Attends religious service: Not on file    Active member of club or organization: Not on file    Attends meetings of clubs or organizations: Not  on file    Relationship status: Not on file  Other Topics Concern  . Not on file  Social History Narrative  . Not on file    His Allergies Are:  No Known Allergies:   His Current Medications Are:  Outpatient Encounter Medications as of 05/17/2018  Medication Sig  . amLODipine (NORVASC) 5 MG tablet Take 1 tablet (5 mg total) by mouth daily.  . benazepril-hydrochlorthiazide (LOTENSIN HCT) 10-12.5 MG tablet Take 1 tablet by mouth daily.  . fish oil-omega-3 fatty acids 1000 MG capsule Take 2 g by mouth daily.  . fluticasone (FLONASE) 50 MCG/ACT nasal spray Place 2 sprays into both nostrils daily.  . Multiple Vitamins-Minerals (MENS 50+ MULTI VITAMIN/MIN PO) Take by mouth.  . Naftifine HCl (NAFTIN) 2 % GEL Apply 1 application topically daily.  . rosuvastatin (CRESTOR) 10 MG  tablet Take 1 tablet (10 mg total) by mouth daily.  . tadalafil (CIALIS) 20 MG tablet Take 0.5-1 tablets (10-20 mg total) by mouth daily as needed for erectile dysfunction.  . terbinafine (LAMISIL) 250 MG tablet Take 1 tablet (250 mg total) by mouth daily.   No facility-administered encounter medications on file as of 05/17/2018.   :  Review of Systems:  Out of a complete 14 point review of systems, all are reviewed and negative with the exception of these symptoms as listed below: Review of Systems  Neurological:       Epworth Sleepiness Scale 0= would never doze 1= slight chance of dozing 2= moderate chance of dozing 3= high chance of dozing  Pt presents today to discuss his sleep. Pt has never had a sleep study but does endorse snoring.  Sitting and reading: 0 Watching TV: 3 Sitting inactive in a public place (ex. Theater or meeting): 1 As a passenger in a car for an hour without a break: 0 Lying down to rest in the afternoon: 0 Sitting and talking to someone: 0 Sitting quietly after lunch (no alcohol): 0 In a car, while stopped in traffic: 0 Total: 4     Objective:  Neurological Exam  Physical Exam Physical Examination:   Vitals:   05/17/18 1534  BP: (!) 150/90  Pulse: 87    General Examination: The patient is a very pleasant 47 y.o. male in no acute distress. He appears well-developed and well-nourished and adequately groomed.   HEENT: Normocephalic, atraumatic, pupils are equal, round and reactive to light and accommodation. Funduscopic exam is normal with sharp disc margins noted. Extraocular tracking is good without limitation to gaze excursion or nystagmus noted. Normal smooth pursuit is noted. Hearing is grossly intact. Tympanic membranes are clear bilaterally. Face is symmetric with normal facial animation and normal facial sensation. Speech is clear with no dysarthria noted. There is no hypophonia. There is no lip, neck/head, jaw or voice tremor. Neck is supple  with full range of passive and active motion. There are no carotid bruits on auscultation. Oropharynx exam reveals: mild mouth dryness, adequate dental hygiene and marked airway crowding, due to wider tongue, smaller airway entry, thicker soft palate, redundant soft palate. Tonsils are not fully visualized tip of uvula not fully visualized. Mallampati is class III. Neck circumference is 18-1/8 inches. He has a minimal overbite.   Chest: Clear to auscultation without wheezing, rhonchi or crackles noted.  Heart: S1+S2+0, regular and normal without murmurs, rubs or gallops noted.   Abdomen: Soft, non-tender and non-distended with normal bowel sounds appreciated on auscultation.  Extremities: There is no pitting edema in  the distal lower extremities bilaterally. Pedal pulses are intact.  Skin: Warm and dry without trophic changes noted.  Musculoskeletal: exam reveals no obvious joint deformities, tenderness or joint swelling or erythema.   Neurologically:  Mental status: The patient is awake, alert and oriented in all 4 spheres. His immediate and remote memory, attention, language skills and fund of knowledge are appropriate. There is no evidence of aphasia, agnosia, apraxia or anomia. Speech is clear with normal prosody and enunciation. Thought process is linear. Mood is normal and affect is normal.  Cranial nerves II - XII are as described above under HEENT exam. In addition: shoulder shrug is normal with equal shoulder height noted. Motor exam: Normal bulk, strength and tone is noted. There is no drift, tremor or rebound. Romberg is negative. Fine motor skills and coordination: grossly intact.  Cerebellar testing: No dysmetria or intention tremor. There is no truncal or gait ataxia.  Sensory exam: intact to light touch in the upper and lower extremities.  Gait, station and balance: He stands easily. No veering to one side is noted. No leaning to one side is noted. Posture is age-appropriate and  stance is narrow based. Gait shows normal stride length and normal pace. No problems turning are noted. Tandem walk is unremarkable. Intact toe and heel stance is noted.               Assessment and Plan:  In summary, Burnis Halling is a very pleasant 47 y.o.-year old male with an underlying medical history of hypertension, hyperlipidemia, back pain and obesity, whose history and physical exam are concerning for obstructive sleep apnea (OSA). I had a long chat with the patient about my findings and the diagnosis of OSA, its prognosis and treatment options. We talked about medical treatments, surgical interventions and non-pharmacological approaches. I explained in particular the risks and ramifications of untreated moderate to severe OSA, especially with respect to developing cardiovascular disease down the Road, including congestive heart failure, difficult to treat hypertension, cardiac arrhythmias, or stroke. Even type 2 diabetes has, in part, been linked to untreated OSA. Symptoms of untreated OSA include daytime sleepiness, memory problems, mood irritability and mood disorder such as depression and anxiety, lack of energy, as well as recurrent headaches, especially morning headaches. We talked about trying to maintain a healthy lifestyle in general, as well as the importance of weight control. I encouraged the patient to eat healthy, exercise daily and keep well hydrated, to keep a scheduled bedtime and wake time routine, to not skip any meals and eat healthy snacks in between meals. I advised the patient not to drive when feeling sleepy. I recommended the following at this time: sleep study with potential positive airway pressure titration. (We will score hypopneas at 3%).   I explained the sleep test procedure to the patient and also outlined possible surgical and non-surgical treatment options of OSA, including the use of a custom-made dental device (which would require a referral to a specialist  dentist or oral surgeon), upper airway surgical options, such as pillar implants, radiofrequency surgery, tongue base surgery, and UPPP (which would involve a referral to an ENT surgeon). Rarely, jaw surgery such as mandibular advancement may be considered.  I also explained the CPAP treatment option to the patient, who indicated that he would be willing to try CPAP if the need arises. I explained the importance of being compliant with PAP treatment, not only for insurance purposes but primarily to improve His symptoms, and for the patient's long  term health benefit, including to reduce His cardiovascular risks. I answered all his questions today and the patient was in agreement. I plan to see him back after the sleep study is completed and encouraged him to call with any interim questions, concerns, problems or updates.   Thank you very much for allowing me to participate in the care of this nice patient. If I can be of any further assistance to you please do not hesitate to call me at [phone].  Sincerely,   Star Age, MD, PhD

## 2018-05-18 ENCOUNTER — Other Ambulatory Visit: Payer: Self-pay | Admitting: Podiatry

## 2018-06-04 ENCOUNTER — Ambulatory Visit (INDEPENDENT_AMBULATORY_CARE_PROVIDER_SITE_OTHER): Payer: BLUE CROSS/BLUE SHIELD | Admitting: Neurology

## 2018-06-04 DIAGNOSIS — R0681 Apnea, not elsewhere classified: Secondary | ICD-10-CM

## 2018-06-04 DIAGNOSIS — R0683 Snoring: Secondary | ICD-10-CM

## 2018-06-04 DIAGNOSIS — G4720 Circadian rhythm sleep disorder, unspecified type: Secondary | ICD-10-CM

## 2018-06-04 DIAGNOSIS — E669 Obesity, unspecified: Secondary | ICD-10-CM

## 2018-06-04 DIAGNOSIS — G4733 Obstructive sleep apnea (adult) (pediatric): Secondary | ICD-10-CM | POA: Diagnosis not present

## 2018-06-04 DIAGNOSIS — R351 Nocturia: Secondary | ICD-10-CM

## 2018-06-04 DIAGNOSIS — Z9189 Other specified personal risk factors, not elsewhere classified: Secondary | ICD-10-CM

## 2018-06-12 ENCOUNTER — Telehealth: Payer: Self-pay

## 2018-06-12 NOTE — Telephone Encounter (Signed)
I called pt. I advised pt that Dr. Rexene Alberts reviewed their sleep study results and found that pt has severe osa but did well with the cpap during his latest sleep study. Dr. Rexene Alberts recommends that pt start a cpap at home. I reviewed PAP compliance expectations with the pt. Pt is agreeable to starting a CPAP. I advised pt that an order will be sent to a DME, [facility], and [facility] will call the pt within about one week after they file with the pt's insurance. [facility] will show the pt how to use the machine, fit for masks, and troubleshoot the CPAP if needed. A follow up appt was made for insurance purposes with Hoyle Sauer, NP on 09/05/18 at 1:15pm. Pt verbalized understanding to arrive 15 minutes early and bring their CPAP. A letter with all of this information in it will be mailed to the pt as a reminder. I verified with the pt that the address we have on file is correct. Pt verbalized understanding of results. Pt had no questions at this time but was encouraged to call back if questions arise. I have sent the order to [facility] and have received confirmation that they have received the order.

## 2018-06-12 NOTE — Progress Notes (Signed)
.  app

## 2018-06-12 NOTE — Telephone Encounter (Signed)
-----   Message from Star Age, MD sent at 06/12/2018  8:17 AM EST ----- Patient referred by Darla Lesches, NP, seen by me on 05/17/18, split night sleep study on 06/04/18. Please call and notify patient that the recent sleep study confirmed the diagnosis of severe OSA. He did well with CPAP during the study with significant improvement of the respiratory events. Therefore, I would like start the patient on CPAP therapy at home by prescribing a machine for home use. I placed the order in the chart.  Please advise patient that we need a follow up appointment with either myself or one of our nurse practitioners in about 10 weeks post set-up to check for how the patient is feeling and how well the patient is using the machine, etc. Please go ahead and schedule the appointment, while you have the patient on the phone and make sure patient understands the importance of keeping this window for the FU appointment, as it is often an insurance requirement. Failing to adhere to this may result in losing coverage for sleep apnea treatment, at which point most patients are left with a choice of returning the machine or paying out of pocket (and we want neither of this to happen!).  Please re-enforce the importance of compliance with treatment and the need for [place] to monitor compliance data - again an insurance requirement and usually a good feedback for the patient as far as how they are doing.  Also remind patient, that any PAP machine or mask issues should be first addressed with the DME company, who provided the machine/mask.  Please ask if patient has a preference regarding DME company, may depend on the insurance too.  Please arrange for CPAP set up at home through a DME company of patient's choice.  Once you have spoken to the patient you can close the phone encounter. Please fax/route report to referring provider, thanks,   Star Age, MD, PhD [facility])

## 2018-06-12 NOTE — Addendum Note (Signed)
Addended by: Star Age on: 06/12/2018 08:18 AM   Modules accepted: Orders

## 2018-06-12 NOTE — Procedures (Signed)
PATIENT'S NAME:  Logan Smith, Logan Smith DOB:      [birth year]      MR#:    [id number]     DATE OF RECORDING: 06/04/2018 REFERRING M.D.:  Darla Lesches, FNP Study Performed:  Split-Night Titration Study HISTORY: 47 year old man with a history of hypertension, hyperlipidemia, back pain and obesity, who reports snoring and sleep disruption. The patient endorsed the Epworth Sleepiness Scale at 4/24 points. The patient's weight 265 pounds with a height of 72 (inches), resulting in a BMI of 35.8 kg/m2. The patient's neck circumference measured 18 inches.  CURRENT MEDICATIONS: Amlodipine, Benazepril, Fish Oil, Fluticasone, Multi-Vitamin, Naftifine, Rosuvastatin, Tadalafil and Terbinafine.  PROCEDURE:  This is a multichannel digital polysomnogram utilizing the Somnostar 11.2 system.  Electrodes and sensors were applied and monitored per AASM Specifications.   EEG, EOG, Chin and Limb EMG, were sampled at 200 Hz.  ECG, Snore and Nasal Pressure, Thermal Airflow, Respiratory Effort, CPAP Flow and Pressure, Oximetry was sampled at 50 Hz. Digital video and audio were recorded.      BASELINE STUDY WITHOUT CPAP RESULTS:  Lights Out was at 20:50 and Lights On at 05:08 for the night, split start at epoch 560. Total recording time (TRT) was 272, with a total sleep time (TST) of 153.5 minutes.  The patient's sleep latency was 100 minutes, which is markedly delayed.  REM latency was 101.5 minutes.  The sleep efficiency was 56.4%, which is reduced.    SLEEP ARCHITECTURE: WASO (Wake after sleep onset) was 49.5 minutes, with moderate sleep fragmentation noted. Stage N1 was 20.5 minutes, Stage N2 was 112 minutes, Stage N3 was 0 minutes and Stage R (REM sleep) was 21 minutes. The percentages were Stage N1 13.4%, Stage N2 73.%, Stage N3 0% and Stage R (REM sleep) 13.7%.  The arousals were noted as: 15 were spontaneous, 0 were associated with PLMs, 183 were associated with respiratory events.  RESPIRATORY ANALYSIS:  There were a total  of 187 respiratory events:  64 obstructive apneas, 0 central apneas and 0 mixed apneas with a total of 64 apneas and an apnea index (AI) of 25.. There were 123 hypopneas with a hypopnea index of 48.1. The patient also had 0 respiratory event related arousals (RERAs).  Snoring was noted.     The total APNEA/HYPOPNEA INDEX (AHI) was 73.1 /hour and the total RESPIRATORY DISTURBANCE INDEX was 73.1 /hour.  25 events occurred in REM sleep and 224 events in NREM. The REM AHI was 71.4, /hour versus a non-REM AHI of 73.4 /hour. The patient spent 239.5 minutes sleep time in the supine position 105 minutes in non-supine. The supine AHI was 73.1 /hour versus a non-supine AHI of 0.0 /hour.  OXYGEN SATURATION & C02:  The wake baseline 02 saturation was 93%, with the lowest being 76%. Time spent below 89% saturation equaled 36 minutes.  PERIODIC LIMB MOVEMENTS: The patient had a total of 0 Periodic Limb Movements.  The Periodic Limb Movement (PLM) index was 0 /hour and the PLM Arousal index was 0 /hour.  Audio and video analysis did not show any abnormal or unusual movements, behaviors, phonations or vocalizations. The patient took one bathroom break for the night. Moderate snoring was noted. The EKG was in keeping with normal sinus rhythm (NSR).   TITRATION STUDY WITH CPAP RESULTS:   The patient was fitted with a MW Dreamwear nasal interface. CPAP was initiated at 5 cmH20 with heated humidity per AASM split night standards and pressure was advanced to 9 cmH20 because of hypopneas,  apneas and desaturations.  At a PAP pressure of 9 cmH20, there was a reduction of the AHI to 0/hour with non-supine REM sleep achieved and O2 nadir of 92%.   Total recording time (TRT) was 227 minutes, with a total sleep time (TST) of 191 minutes. The patient's sleep latency was 29 minutes. REM latency was 53 minutes.  The sleep efficiency was 84.1 %.    SLEEP ARCHITECTURE: Wake after sleep was 7 minutes, Stage N1 10 minutes, Stage N2  138.5 minutes, Stage N3 0 minutes and Stage R (REM sleep) 42.5 minutes. The percentages were: Stage N1 5.2%, Stage N2 72.5%, Stage N3 0% and Stage R (REM sleep) 22.3%. The arousals were noted as: 15 were spontaneous, 0 were associated with PLMs, 3 were associated with respiratory events.  RESPIRATORY ANALYSIS:  There were a total of 3 respiratory events: 0 obstructive apneas, 0 central apneas and 0 mixed apneas with a total of 0 apneas and an apnea index (AI) of 0. There were 3 hypopneas with a hypopnea index of .9 /hour. The patient also had 0 respiratory event related arousals (RERAs).      The total APNEA/HYPOPNEA INDEX  (AHI) was .9 /hour and the total RESPIRATORY DISTURBANCE INDEX was .9 /hour.  3 events occurred in REM sleep and 0 events in NREM. The REM AHI was 4.2 /hour versus a non-REM AHI of 0 /hour. REM sleep was achieved on a pressure of  cm/h2o (AHI was  .) The patient spent 45% of total sleep time in the supine position. The supine AHI was 0.7 /hour, versus a non-supine AHI of 1.1/hour.  OXYGEN SATURATION & C02:  The wake baseline 02 saturation was 95%, with the lowest being 49%. Time spent below 89% saturation equaled 1 minutes.  PERIODIC LIMB MOVEMENTS: The patient had a total of 0 Periodic Limb Movements. The Periodic Limb Movement (PLM) index was 0 /hour and the PLM Arousal index was 0 /hour.  Post-study, the patient indicated that sleep was worse than usual.  POLYSOMNOGRAPHY IMPRESSION :   1. Obstructive Sleep Apnea (OSA)  2. Dysfunctions associated with sleep stages or arousals from sleep RECOMMENDATIONS:  1. This patient has severe obstructive sleep apnea and responded to CPAP therapy. Based on the test results, I will recommend a home CPAP treatment pressure of 9 cm via medium-wide nasal mask with heated humidity. The patient should be reminded to be fully compliant with PAP therapy to improve sleep related symptoms and decrease long term cardiovascular risks. Please note that  untreated obstructive sleep apnea may carry additional perioperative morbidity. Patients with significant obstructive sleep apnea should receive perioperative PAP therapy and the surgeons and particularly the anesthesiologist should be informed of the diagnosis and the severity of the sleep disordered breathing. 2. This study shows sleep fragmentation and abnormal sleep stage percentages; these are nonspecific findings and per se do not signify an intrinsic sleep disorder or a cause for the patient's sleep-related symptoms. Causes include (but are not limited to) the first night effect of the sleep study, circadian rhythm disturbances, medication effect or an underlying mood disorder or medical problem.  3. The patient should be cautioned not to drive, work at heights, or operate dangerous or heavy equipment when tired or sleepy. Review and reiteration of good sleep hygiene measures should be pursued with any patient. 4. The patient will be seen in follow-up in the sleep clinic at [facility] for discussion of the test results, symptom and treatment compliance review, further management strategies, etc.  The referring provider will be notified of the test results.  I certify that I have reviewed the entire raw data recording prior to the issuance of this report in accordance with the Standards of Accreditation of the American Academy of Sleep Medicine (AASM)    Star Age, MD, PhD Diplomat, American Board of Neurology and Sleep Medicine (Neurology and Sleep Medicine)

## 2018-06-12 NOTE — Progress Notes (Signed)
Patient referred by Darla Lesches, NP, seen by me on 05/17/18, split night sleep study on 06/04/18. Please call and notify patient that the recent sleep study confirmed the diagnosis of severe OSA. He did well with CPAP during the study with significant improvement of the respiratory events. Therefore, I would like start the patient on CPAP therapy at home by prescribing a machine for home use. I placed the order in the chart.  Please advise patient that we need a follow up appointment with either myself or one of our nurse practitioners in about 10 weeks post set-up to check for how the patient is feeling and how well the patient is using the machine, etc. Please go ahead and schedule the appointment, while you have the patient on the phone and make sure patient understands the importance of keeping this window for the FU appointment, as it is often an insurance requirement. Failing to adhere to this may result in losing coverage for sleep apnea treatment, at which point most patients are left with a choice of returning the machine or paying out of pocket (and we want neither of this to happen!).  Please re-enforce the importance of compliance with treatment and the need for [place] to monitor compliance data - again an insurance requirement and usually a good feedback for the patient as far as how they are doing.  Also remind patient, that any PAP machine or mask issues should be first addressed with the DME company, who provided the machine/mask.  Please ask if patient has a preference regarding DME company, may depend on the insurance too.  Please arrange for CPAP set up at home through a DME company of patient's choice.  Once you have spoken to the patient you can close the phone encounter. Please fax/route report to referring provider, thanks,   Star Age, MD, PhD [facility])

## 2018-09-04 NOTE — Progress Notes (Deleted)
[facility]  PATIENT: Logan Smith DOB: [birth year]   REASON FOR VISIT: Follow-up for newly diagnosed obstructive sleep apnea here for initial CPAP HISTORY FROM:    HISTORY OF PRESENT ILLNESS: 05/17/18 Logan Smith is a 48 year old right-handed gentleman with an underlying medical history of hypertension, hyperlipidemia, back pain and obesity, who reports snoring and sleep disruption. I reviewed your office note from 05/10/2018. He has also been referred to ENT. His Epworth sleepiness score is 4 out of 24 today, fatigue score is 12 out of 63. He is single, has 2 kids, ages 59 and 59. 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 He also works part-time for [employer] till about 10 PM each day. Bedtime is around 11 and rise time around 6. He has nocturia about once per hour tonight. He lives with his girlfriend, who has complained about his loud snoring and previously he was told that he has pauses in his breathing. He does not wake up gasping for air and denies any morning headaches. He denies restless leg symptoms or a family history of sleep apnea. He is a nonsmoker and drinks alcohol occasionally, caffeine in the form of soda, one bottle per day on average. He is trying to lose weight. He has lost a little bit of weight.    REVIEW OF SYSTEMS: Full 14 system review of systems performed and notable only for those listed, all others are neg:  Constitutional: neg  Cardiovascular: neg Ear/Nose/Throat: neg  Skin: neg Eyes: neg Respiratory: neg Gastroitestinal: neg  Hematology/Lymphatic: neg  Endocrine: neg Musculoskeletal:neg Allergy/Immunology: neg Neurological: neg Psychiatric: neg Sleep : neg   ALLERGIES: No Known Allergies  HOME MEDICATIONS: Outpatient Medications Prior to Visit  Medication Sig Dispense Refill  . amLODipine (NORVASC) 5 MG tablet Take 1 tablet (5 mg total) by mouth daily. 90 tablet 1  .  benazepril-hydrochlorthiazide (LOTENSIN HCT) 10-12.5 MG tablet Take 1 tablet by mouth daily. 90 tablet 3  . fish oil-omega-3 fatty acids 1000 MG capsule Take 2 g by mouth daily.    . fluticasone (FLONASE) 50 MCG/ACT nasal spray Place 2 sprays into both nostrils daily. 16 g 6  . Multiple Vitamins-Minerals (MENS 50+ MULTI VITAMIN/MIN PO) Take by mouth.    . Naftifine HCl (NAFTIN) 2 % GEL Apply 1 application topically daily. 1 Tube 0  . rosuvastatin (CRESTOR) 10 MG tablet Take 1 tablet (10 mg total) by mouth daily. 90 tablet 1  . tadalafil (CIALIS) 20 MG tablet Take 0.5-1 tablets (10-20 mg total) by mouth daily as needed for erectile dysfunction. 10 tablet 3  . terbinafine (LAMISIL) 250 MG tablet Take 1 tablet (250 mg total) by mouth daily. 14 tablet 0   No facility-administered medications prior to visit.     PAST MEDICAL HISTORY: Past Medical History:  Diagnosis Date  . Back pain   . Erectile dysfunction   . Hyperlipidemia   . Hypertension     PAST SURGICAL HISTORY: Past Surgical History:  Procedure Laterality Date  . HEMORROIDECTOMY    . VASECTOMY      FAMILY HISTORY: Family History  Problem Relation Age of Onset  . Diabetes Mother   . Hypertension Mother   . Hypertension Father     SOCIAL HISTORY: Social History   Socioeconomic History  . Marital status: Single    Spouse name: Not on file  . Number of children: Not on file  . Years of education: Not on file  .  Highest education level: Not on file  Occupational History  . Not on file  Social Needs  . Financial resource strain: Not on file  . Food insecurity:    Worry: Not on file    Inability: Not on file  . Transportation needs:    Medical: Not on file    Non-medical: Not on file  Tobacco Use  . Smoking status: Never Smoker  . Smokeless tobacco: Never Used  Substance and Sexual Activity  . Alcohol use: No    Comment: rarely  . Drug use: No  . Sexual activity: Not on file  Lifestyle  . Physical  activity:    Days per week: Not on file    Minutes per session: Not on file  . Stress: Not on file  Relationships  . Social connections:    Talks on phone: Not on file    Gets together: Not on file    Attends religious service: Not on file    Active member of club or organization: Not on file    Attends meetings of clubs or organizations: Not on file    Relationship status: Not on file  . Intimate partner violence:    Fear of current or ex partner: Not on file    Emotionally abused: Not on file    Physically abused: Not on file    Forced sexual activity: Not on file  Other Topics Concern  . Not on file  Social History Narrative  . Not on file     PHYSICAL EXAM  There were no vitals filed for this visit. There is no height or weight on file to calculate BMI.  Generalized: Well developed, in no acute distress  Head: normocephalic and atraumatic,. Oropharynx benign  Neck: Supple, no carotid bruits  Cardiac: Regular rate rhythm, no murmur  Musculoskeletal: No deformity   Neurological examination   Mentation: Alert oriented to time, place, history taking. Attention span and concentration appropriate. Recent and remote memory intact.  Follows all commands speech and language fluent.   Cranial nerve II-XII: Fundoscopic exam reveals sharp disc margins.Pupils were equal round reactive to light extraocular movements were full, visual field were full on confrontational test. Facial sensation and strength were normal. hearing was intact to finger rubbing bilaterally. Uvula tongue midline. head turning and shoulder shrug were normal and symmetric.Tongue protrusion into cheek strength was normal. Motor: normal bulk and tone, full strength in the BUE, BLE, fine finger movements normal, no pronator drift. No focal weakness Sensory: normal and symmetric to light touch, pinprick, and  Vibration, proprioception  Coordination: finger-nose-finger, heel-to-shin bilaterally, no  dysmetria Reflexes: Brachioradialis 2/2, biceps 2/2, triceps 2/2, patellar 2/2, Achilles 2/2, plantar responses were flexor bilaterally. Gait and Station: Rising up from seated position without assistance, normal stance,  moderate stride, good arm swing, smooth turning, able to perform tiptoe, and heel walking without difficulty. Tandem gait is steady  DIAGNOSTIC DATA (LABS, IMAGING, TESTING) - I reviewed patient records, labs, notes, testing and imaging myself where available.  Lab Results  Component Value Date   WBC 7.7 05/10/2018   HGB 13.7 05/10/2018   HCT 41.8 05/10/2018   MCV 93 05/10/2018   PLT 326 05/10/2018      Component Value Date/Time   NA 144 05/10/2018 0831   K 3.7 05/10/2018 0831   CL 102 05/10/2018 0831   CO2 24 05/10/2018 0831   GLUCOSE 92 05/10/2018 0831   GLUCOSE 94 01/05/2013 1129   BUN 10 05/10/2018 0831  CREATININE 0.84 05/10/2018 0831   CREATININE 0.90 01/05/2013 1129   CALCIUM 9.5 05/10/2018 0831   PROT 6.8 05/10/2018 0831   ALBUMIN 4.3 05/10/2018 0831   AST 21 05/10/2018 0831   ALT 19 05/10/2018 0831   ALKPHOS 79 05/10/2018 0831   BILITOT 0.3 05/10/2018 0831   GFRNONAA 105 05/10/2018 0831   GFRNONAA >89 01/05/2013 1129   GFRAA 121 05/10/2018 0831   GFRAA >89 01/05/2013 1129   Lab Results  Component Value Date   CHOL 94 (L) 05/10/2018   HDL 35 (L) 05/10/2018   LDLCALC 43 05/10/2018   TRIG 82 05/10/2018   CHOLHDL 2.7 05/10/2018   Lab Results  Component Value Date   HGBA1C 4.9 06/03/2017   No results found for: VITAMINB12 Lab Results  Component Value Date   TSH 1.690 05/10/2018      ASSESSMENT AND PLAN  48 y.o. year old male  has a past medical history of Back pain, Erectile dysfunction, Hyperlipidemia, and Hypertension. here with *** Logan Smith is a very pleasant 48 y.o.-year old male with an underlying medical history of hypertension, hyperlipidemia, back pain and obesity, whose history and physical exam are concerning for  obstructive sleep apnea (OSA). I had a long chat with the patient about my findings and the diagnosis of OSA, its prognosis and treatment options. We talked about medical treatments, surgical interventions and non-pharmacological approaches. I explained in particular the risks and ramifications of untreated moderate to severe OSA, especially with respect to developing cardiovascular disease down the Road, including congestive heart failure, difficult to treat hypertension, cardiac arrhythmias, or stroke. Even type 2 diabetes has, in part, been linked to untreated OSA. Symptoms of untreated OSA include daytime sleepiness, memory problems, mood irritability and mood disorder such as depression and anxiety, lack of energy, as well as recurrent headaches, especially morning headaches. We talked about trying to maintain a healthy lifestyle in general, as well as the importance of weight control. I encouraged the patient to eat healthy, exercise    Dennie Bible, [facility], [facility], APRN  [facility] [address] [place], [place] [address] [phone]

## 2018-09-05 ENCOUNTER — Ambulatory Visit: Payer: Self-pay | Admitting: Nurse Practitioner

## 2018-11-09 ENCOUNTER — Ambulatory Visit: Payer: BLUE CROSS/BLUE SHIELD | Admitting: Family Medicine

## 2018-11-10 ENCOUNTER — Encounter: Payer: Self-pay | Admitting: Family Medicine

## 2018-11-10 ENCOUNTER — Other Ambulatory Visit: Payer: Self-pay

## 2018-11-10 ENCOUNTER — Ambulatory Visit (INDEPENDENT_AMBULATORY_CARE_PROVIDER_SITE_OTHER): Payer: BLUE CROSS/BLUE SHIELD | Admitting: Family Medicine

## 2018-11-10 VITALS — BP 148/97 | HR 79 | Temp 97.9°F | Ht 72.0 in | Wt 271.0 lb

## 2018-11-10 DIAGNOSIS — I10 Essential (primary) hypertension: Secondary | ICD-10-CM

## 2018-11-10 DIAGNOSIS — E669 Obesity, unspecified: Secondary | ICD-10-CM

## 2018-11-10 DIAGNOSIS — N528 Other male erectile dysfunction: Secondary | ICD-10-CM

## 2018-11-10 DIAGNOSIS — G5601 Carpal tunnel syndrome, right upper limb: Secondary | ICD-10-CM

## 2018-11-10 DIAGNOSIS — E782 Mixed hyperlipidemia: Secondary | ICD-10-CM

## 2018-11-10 LAB — CMP14+EGFR
ALT: 22 IU/L (ref 0–44)
AST: 16 IU/L (ref 0–40)
Albumin/Globulin Ratio: 2 (ref 1.2–2.2)
Albumin: 4.4 g/dL (ref 4.0–5.0)
Alkaline Phosphatase: 76 IU/L (ref 39–117)
BUN/Creatinine Ratio: 12 (ref 9–20)
BUN: 11 mg/dL (ref 6–24)
Bilirubin Total: 0.5 mg/dL (ref 0.0–1.2)
CO2: 24 mmol/L (ref 20–29)
Calcium: 9.5 mg/dL (ref 8.7–10.2)
Chloride: 102 mmol/L (ref 96–106)
Creatinine, Ser: 0.95 mg/dL (ref 0.76–1.27)
GFR calc Af Amer: 111 mL/min/{1.73_m2} (ref >59)
GFR calc non Af Amer: 96 mL/min/{1.73_m2} (ref >59)
Globulin, Total: 2.2 g/dL (ref 1.5–4.5)
Glucose: 126 mg/dL — ABNORMAL HIGH (ref 65–99)
Potassium: 3.7 mmol/L (ref 3.5–5.2)
Sodium: 140 mmol/L (ref 134–144)
Total Protein: 6.6 g/dL (ref 6.0–8.5)

## 2018-11-10 LAB — LIPID PANEL
Chol/HDL Ratio: 3 ratio (ref 0.0–5.0)
Cholesterol, Total: 102 mg/dL (ref 100–199)
HDL: 34 mg/dL — ABNORMAL LOW (ref >39)
LDL Calculated: 56 mg/dL (ref 0–99)
Triglycerides: 60 mg/dL (ref 0–149)
VLDL Cholesterol Cal: 12 mg/dL (ref 5–40)

## 2018-11-10 MED ORDER — TADALAFIL 20 MG PO TABS: 10.0000 mg | ORAL_TABLET | Freq: Every day | ORAL | 3 refills | Status: DC | PRN

## 2018-11-10 MED ORDER — AMLODIPINE BESYLATE 5 MG PO TABS: 5.0000 mg | ORAL_TABLET | Freq: Every day | ORAL | 1 refills | Status: DC

## 2018-11-10 MED ORDER — BENAZEPRIL-HYDROCHLOROTHIAZIDE 10-12.5 MG PO TABS: 1.0000 | ORAL_TABLET | Freq: Every day | ORAL | 3 refills | Status: DC

## 2018-11-10 MED ORDER — ROSUVASTATIN CALCIUM 10 MG PO TABS: 10.0000 mg | ORAL_TABLET | Freq: Every day | ORAL | 1 refills | Status: DC

## 2018-11-10 NOTE — Patient Instructions (Signed)
Carpal Tunnel Syndrome    Carpal tunnel syndrome is a condition that causes pain in your hand and arm. The carpal tunnel is a narrow area that is on the palm side of your wrist. Repeated wrist motion or certain diseases may cause swelling in the tunnel. This swelling can pinch the main nerve in the wrist (median nerve).  What are the causes?  This condition may be caused by:   Repeated wrist motions.   Wrist injuries.   Arthritis.   A sac of fluid (cyst) or abnormal growth (tumor) in the carpal tunnel.   Fluid buildup during pregnancy.  Sometimes the cause is not known.  What increases the risk?  The following factors may make you more likely to develop this condition:   Having a job in which you move your wrist in the same way many times. This includes jobs like being a butcher or a cashier.   Being a woman.   Having other health conditions, such as:  ? Diabetes.  ? Obesity.  ? A thyroid gland that is not active enough (hypothyroidism).  ? Kidney failure.  What are the signs or symptoms?  Symptoms of this condition include:   A tingling feeling in your fingers.   Tingling or a loss of feeling (numbness) in your hand.   Pain in your entire arm. This pain may get worse when you bend your wrist and elbow for a long time.   Pain in your wrist that goes up your arm to your shoulder.   Pain that goes down into your palm or fingers.   A weak feeling in your hands. You may find it hard to grab and hold items.  You may feel worse at night.  How is this diagnosed?  This condition is diagnosed with a medical history and physical exam. You may also have tests, such as:   Electromyogram (EMG). This test checks the signals that the nerves send to the muscles.   Nerve conduction study. This test checks how well signals pass through your nerves.   Imaging tests, such as X-rays, ultrasound, and MRI. These tests check for what might be the cause of your condition.  How is this treated?  This condition may be treated  with:   Lifestyle changes. You will be asked to stop or change the activity that caused your problem.   Doing exercise and activities that make bones and muscles stronger (physical therapy).   Learning how to use your hand again (occupational therapy).   Medicines for pain and swelling (inflammation). You may have injections in your wrist.   A wrist splint.   Surgery.  Follow these instructions at home:  If you have a splint:   Wear the splint as told by your doctor. Remove it only as told by your doctor.   Loosen the splint if your fingers:  ? Tingle.  ? Lose feeling (become numb).  ? Turn cold and blue.   Keep the splint clean.   If the splint is not waterproof:  ? Do not let it get wet.  ? Cover it with a watertight covering when you take a bath or a shower.  Managing pain, stiffness, and swelling     If told, put ice on the painful area:  ? If you have a removable splint, remove it as told by your doctor.  ? Put ice in a plastic bag.  ? Place a towel between your skin and the bag.  ? Leave the   ice on for 20 minutes, 2-3 times per day.  General instructions   Take over-the-counter and prescription medicines only as told by your doctor.   Rest your wrist from any activity that may cause pain. If needed, talk with your boss at work about changes that can help your wrist heal.   Do any exercises as told by your doctor, physical therapist, or occupational therapist.   Keep all follow-up visits as told by your doctor. This is important.  Contact a doctor if:   You have new symptoms.   Medicine does not help your pain.   Your symptoms get worse.  Get help right away if:   You have very bad numbness or tingling in your wrist or hand.  Summary   Carpal tunnel syndrome is a condition that causes pain in your hand and arm.   It is often caused by repeated wrist motions.   Lifestyle changes and medicines are used to treat this problem. Surgery may help in very bad cases.   Follow your doctor's  instructions about wearing a splint, resting your wrist, keeping follow-up visits, and calling for help.  This information is not intended to replace advice given to you by your health care provider. Make sure you discuss any questions you have with your health care provider.  Document Released: 06/17/2011 Document Revised: 11/04/2017 Document Reviewed: 11/04/2017  [employer]vier Interactive Patient Education  2019 [employer]

## 2018-11-10 NOTE — Progress Notes (Addendum)
Subjective:  Patient ID: Logan Smith, male    DOB: 1971-05-02, 48 y.o.   MRN: [id number]  Chief Complaint:  Medical Management of Chronic Issues; Hypertension; Hyperlipidemia; and Hand Pain   HPI: Logan Smith is a 48 y.o. male presenting on 11/10/2018 for Medical Management of Chronic Issues; Hypertension; Hyperlipidemia; and Hand Pain  Hypertension  This is a chronic problem. The current episode started more than 1 year ago. The problem has been gradually improving since onset. The problem is controlled. Pertinent negatives include no anxiety, blurred vision, chest pain, headaches, malaise/fatigue, neck pain, orthopnea, palpitations, peripheral edema, PND, shortness of breath or sweats. There are no associated agents to hypertension. Risk factors for coronary artery disease include dyslipidemia, obesity, male gender and stress. Past treatments include ACE inhibitors, calcium channel blockers and diuretics. The current treatment provides significant improvement. There are no compliance problems.  There is no history of angina, kidney disease, CAD/MI, CVA, heart failure, left ventricular hypertrophy, PVD or retinopathy.  Hyperlipidemia  This is a chronic problem. The current episode started more than 1 year ago. The problem is controlled. Exacerbating diseases include obesity. There are no known factors aggravating his hyperlipidemia. Pertinent negatives include no chest pain, focal sensory loss, focal weakness, leg pain, myalgias or shortness of breath. Current antihyperlipidemic treatment includes statins and herbal therapy. The current treatment provides moderate improvement of lipids. There are no compliance problems.  Risk factors for coronary artery disease include dyslipidemia, hypertension, obesity, male sex and stress.  Hand Pain   The incident occurred more than 1 week ago. There was no injury mechanism. The pain is present in the right fingers (tingling to right second and third  fingers). Quality: tingling. The pain radiates to the right arm. The pain is at a severity of 4/10. The pain is mild. The pain has been intermittent since the incident. Associated symptoms include tingling. Pertinent negatives include no chest pain, muscle weakness or numbness. Nothing aggravates the symptoms. He has tried NSAIDs and acetaminophen for the symptoms. The treatment provided mild relief.     Relevant past medical, surgical, family, and social history reviewed and updated as indicated.  Allergies and medications reviewed and updated.   Past Medical History:  Diagnosis Date   Back pain    Erectile dysfunction    Hyperlipidemia    Hypertension     Past Surgical History:  Procedure Laterality Date   HEMORROIDECTOMY     VASECTOMY      Social History   Socioeconomic History   Marital status: Single    Spouse name: Not on file   Number of children: Not on file   Years of education: Not on file   Highest education level: Not on file  Occupational History   Not on file  Social Needs   Financial resource strain: Not on file   Food insecurity:    Worry: Not on file    Inability: Not on file   Transportation needs:    Medical: Not on file    Non-medical: Not on file  Tobacco Use   Smoking status: Never Smoker   Smokeless tobacco: Never Used  Substance and Sexual Activity   Alcohol use: No    Comment: rarely   Drug use: No   Sexual activity: Not on file  Lifestyle   Physical activity:    Days per week: Not on file    Minutes per session: Not on file   Stress: Not on file  Relationships  Social connections:    Talks on phone: Not on file    Gets together: Not on file    Attends religious service: Not on file    Active member of club or organization: Not on file    Attends meetings of clubs or organizations: Not on file    Relationship status: Not on file   Intimate partner violence:    Fear of current or ex partner: Not on file     Emotionally abused: Not on file    Physically abused: Not on file    Forced sexual activity: Not on file  Other Topics Concern   Not on file  Social History Narrative   Not on file    Outpatient Encounter Medications as of 11/10/2018  Medication Sig   amLODipine (NORVASC) 5 MG tablet Take 1 tablet (5 mg total) by mouth daily.   benazepril-hydrochlorthiazide (LOTENSIN HCT) 10-12.5 MG tablet Take 1 tablet by mouth daily.   fish oil-omega-3 fatty acids 1000 MG capsule Take 2 g by mouth daily.   Multiple Vitamins-Minerals (MENS 50+ MULTI VITAMIN/MIN PO) Take by mouth.   rosuvastatin (CRESTOR) 10 MG tablet Take 1 tablet (10 mg total) by mouth daily.   tadalafil (CIALIS) 20 MG tablet Take 0.5-1 tablets (10-20 mg total) by mouth daily as needed for erectile dysfunction.   [DISCONTINUED] amLODipine (NORVASC) 5 MG tablet Take 1 tablet (5 mg total) by mouth daily.   [DISCONTINUED] benazepril-hydrochlorthiazide (LOTENSIN HCT) 10-12.5 MG tablet Take 1 tablet by mouth daily.   [DISCONTINUED] rosuvastatin (CRESTOR) 10 MG tablet Take 1 tablet (10 mg total) by mouth daily.   [DISCONTINUED] tadalafil (CIALIS) 20 MG tablet Take 0.5-1 tablets (10-20 mg total) by mouth daily as needed for erectile dysfunction.   [DISCONTINUED] fluticasone (FLONASE) 50 MCG/ACT nasal spray Place 2 sprays into both nostrils daily.   [DISCONTINUED] Naftifine HCl (NAFTIN) 2 % GEL Apply 1 application topically daily.   [DISCONTINUED] terbinafine (LAMISIL) 250 MG tablet Take 1 tablet (250 mg total) by mouth daily.   No facility-administered encounter medications on file as of 11/10/2018.     No Known Allergies  Review of Systems  Constitutional: Negative for activity change, appetite change, chills, diaphoresis, fatigue, fever, malaise/fatigue and unexpected weight change.  Eyes: Negative for blurred vision and photophobia.  Respiratory: Negative for chest tightness and shortness of breath.   Cardiovascular:  Negative for chest pain, palpitations, orthopnea, leg swelling and PND.  Musculoskeletal: Negative for arthralgias, myalgias and neck pain.       Right second and third finger tingling that radiates up arm at times.   Skin: Negative for color change, pallor, rash and wound.  Neurological: Positive for tingling. Negative for dizziness, tremors, focal weakness, seizures, syncope, facial asymmetry, speech difficulty, weakness, light-headedness, numbness and headaches.  Psychiatric/Behavioral: Negative for confusion.  All other systems reviewed and are negative.       Objective:  BP (!) 148/97    Pulse 79    Temp 97.9 F (36.6 C) (Oral)    Ht 6' (1.829 m)    Wt 271 lb (122.9 kg)    BMI 36.75 kg/m    Wt Readings from Last 3 Encounters:  11/10/18 271 lb (122.9 kg)  05/17/18 265 lb (120.2 kg)  05/10/18 271 lb (122.9 kg)    Physical Exam Vitals signs and nursing note reviewed.  Constitutional:      General: He is not in acute distress.    Appearance: Normal appearance. He is well-developed and well-groomed. He is  obese. He is not ill-appearing or toxic-appearing.  HENT:     Head: Normocephalic and atraumatic.     Right Ear: Hearing normal.     Left Ear: Hearing normal.     Nose: Nose normal.     Mouth/Throat:     Lips: Pink.     Mouth: Mucous membranes are moist.     Pharynx: Oropharynx is clear. Uvula midline.  Eyes:     General: Lids are normal.     Extraocular Movements: Extraocular movements intact.     Conjunctiva/sclera: Conjunctivae normal.     Pupils: Pupils are equal, round, and reactive to light.  Neck:     Musculoskeletal: Normal range of motion and neck supple.     Thyroid: No thyroid mass, thyromegaly or thyroid tenderness.     Vascular: No carotid bruit or JVD.     Trachea: Trachea and phonation normal.  Cardiovascular:     Rate and Rhythm: Normal rate and regular rhythm.     Chest Wall: PMI is not displaced.     Pulses: Normal pulses.     Heart sounds: Normal  heart sounds. No murmur. No friction rub. No gallop.   Pulmonary:     Effort: Pulmonary effort is normal. No respiratory distress.     Breath sounds: Normal breath sounds.  Abdominal:     General: Bowel sounds are normal. There is no distension or abdominal bruit.     Palpations: Abdomen is soft.     Tenderness: There is no abdominal tenderness. There is no right CVA tenderness or left CVA tenderness.  Musculoskeletal:     Right forearm: Normal.     Right hand: He exhibits normal range of motion, no tenderness, normal two-point discrimination, normal capillary refill, no deformity, no laceration and no swelling. Decreased sensation noted. Decreased sensation is present in the medial distribution. Decreased sensation is not present in the ulnar distribution and is not present in the radial distribution. Normal strength noted.       Hands:     Right lower leg: No edema.     Left lower leg: No edema.  Lymphadenopathy:     Cervical: No cervical adenopathy.  Skin:    General: Skin is warm and dry.     Capillary Refill: Capillary refill takes less than 2 seconds.  Neurological:     General: No focal deficit present.     Mental Status: He is alert and oriented to person, place, and time.     Cranial Nerves: No cranial nerve deficit.     Sensory: No sensory deficit.     Motor: No weakness.     Coordination: Coordination normal.     Gait: Gait normal.     Deep Tendon Reflexes: Reflexes normal.  Psychiatric:        Mood and Affect: Mood normal.        Behavior: Behavior normal. Behavior is cooperative.        Thought Content: Thought content normal.        Judgment: Judgment normal.     Results for orders placed or performed in visit on 05/10/18  CMP14+EGFR  Result Value Ref Range   Glucose 92 65 - 99 mg/dL   BUN 10 6 - 24 mg/dL   Creatinine, Ser 0.84 0.76 - 1.27 mg/dL   GFR calc non Af Amer 105 >59 mL/min/1.73   GFR calc Af Amer 121 >59 mL/min/1.73   BUN/Creatinine Ratio 12 9 - 20    Sodium 144  134 - 144 mmol/L   Potassium 3.7 3.5 - 5.2 mmol/L   Chloride 102 96 - 106 mmol/L   CO2 24 20 - 29 mmol/L   Calcium 9.5 8.7 - 10.2 mg/dL   Total Protein 6.8 6.0 - 8.5 g/dL   Albumin 4.3 3.5 - 5.5 g/dL   Globulin, Total 2.5 1.5 - 4.5 g/dL   Albumin/Globulin Ratio 1.7 1.2 - 2.2   Bilirubin Total 0.3 0.0 - 1.2 mg/dL   Alkaline Phosphatase 79 39 - 117 IU/L   AST 21 0 - 40 IU/L   ALT 19 0 - 44 IU/L  CBC with Differential/Platelet  Result Value Ref Range   WBC 7.7 3.4 - 10.8 x10E3/uL   RBC 4.50 4.14 - 5.80 x10E6/uL   Hemoglobin 13.7 13.0 - 17.7 g/dL   Hematocrit 41.8 37.5 - 51.0 %   MCV 93 79 - 97 fL   MCH 30.4 26.6 - 33.0 pg   MCHC 32.8 31.5 - 35.7 g/dL   RDW 11.8 (L) 12.3 - 15.4 %   Platelets 326 150 - 450 x10E3/uL   Neutrophils 67 Not Estab. %   Lymphs 23 Not Estab. %   Monocytes 8 Not Estab. %   Eos 2 Not Estab. %   Basos 0 Not Estab. %   Neutrophils Absolute 5.1 1.4 - 7.0 x10E3/uL   Lymphocytes Absolute 1.8 0.7 - 3.1 x10E3/uL   Monocytes Absolute 0.6 0.1 - 0.9 x10E3/uL   EOS (ABSOLUTE) 0.2 0.0 - 0.4 x10E3/uL   Basophils Absolute 0.0 0.0 - 0.2 x10E3/uL   Immature Granulocytes 0 Not Estab. %   Immature Grans (Abs) 0.0 0.0 - 0.1 x10E3/uL  Lipid panel  Result Value Ref Range   Cholesterol, Total 94 (L) 100 - 199 mg/dL   Triglycerides 82 0 - 149 mg/dL   HDL 35 (L) >39 mg/dL   VLDL Cholesterol Cal 16 5 - 40 mg/dL   LDL Calculated 43 0 - 99 mg/dL   Chol/HDL Ratio 2.7 0.0 - 5.0 ratio  TSH  Result Value Ref Range   TSH 1.690 0.450 - 4.500 uIU/mL  Microalbumin / creatinine urine ratio  Result Value Ref Range   Creatinine, Urine 373.5 Not Estab. mg/dL   Microalbumin, Urine 21.3 Not Estab. ug/mL   Microalb/Creat Ratio 5.7 0.0 - 30.0 mg/g creat       Pertinent labs & imaging results that were available during my care of the patient were reviewed by me and considered in my medical decision making.  Assessment & Plan:  Logan Smith was seen today for medical management  of chronic issues, hypertension, hyperlipidemia and hand pain.  Diagnoses and all orders for this visit:  Essential hypertension BP elevated in office today. Pt has not taken medications this morning. BP readings at home have been good. Continue medications as prescribed. Report any persistent high readings. Labs pending. Diet and exercise encouraged.  -     CMP14+EGFR -     Lipid panel -     amLODipine (NORVASC) 5 MG tablet; Take 1 tablet (5 mg total) by mouth daily. -     benazepril-hydrochlorthiazide (LOTENSIN HCT) 10-12.5 MG tablet; Take 1 tablet by mouth daily.  Mixed hyperlipidemia Diet and exercise encouraged. Medications as prescribed. Labs pending.  -     CMP14+EGFR -     Lipid panel -     rosuvastatin (CRESTOR) 10 MG tablet; Take 1 tablet (10 mg total) by mouth daily.  Obesity (BMI 30-39.9) Diet and exercise encouraged.  -  CMP14+EGFR -     Lipid panel  Carpal tunnel syndrome on right Wrist splint applied in office. Symptomatic care discussed. If symptoms have not improved in 4 weeks, return for reevaluation.   Other male erectile dysfunction Not taking but would like a refill if needed.  -     tadalafil (CIALIS) 20 MG tablet; Take 0.5-1 tablets (10-20 mg total) by mouth daily as needed for erectile dysfunction.     Continue all other maintenance medications.  Follow up plan: Return in about 6 months (around 05/13/2019), or if symptoms worsen or fail to improve, for HTN.  Educational handout given for carpal tunnel syndrome   The above assessment and management plan was discussed with the patient. The patient verbalized understanding of and has agreed to the management plan. Patient is aware to call the clinic if symptoms persist or worsen. Patient is aware when to return to the clinic for a follow-up visit. Patient educated on when it is appropriate to go to the emergency department.   Monia Pouch, FNP-C [facility] [phone]

## 2018-11-13 ENCOUNTER — Telehealth: Payer: Self-pay | Admitting: Family Medicine

## 2018-11-13 NOTE — Telephone Encounter (Signed)
Pharmacy [facility]   Pt is wanting to know if we can send something cheaper to pharmacy the tadalafil (CIALIS) 20 MG tablet is to expensive for him  Pt also wants some clarification on if he is suppose to stop drinking sugary drinks completley or is this just temporary?

## 2018-11-14 ENCOUNTER — Telehealth: Payer: Self-pay | Admitting: Family Medicine

## 2018-11-14 NOTE — Telephone Encounter (Signed)
Pt aware that [facility]akes is off today and will address once back in the office.

## 2018-11-15 ENCOUNTER — Other Ambulatory Visit: Payer: Self-pay | Admitting: Family Medicine

## 2018-11-15 DIAGNOSIS — N529 Male erectile dysfunction, unspecified: Secondary | ICD-10-CM

## 2018-11-15 MED ORDER — SILDENAFIL CITRATE 25 MG PO TABS: 25.0000 mg | ORAL_TABLET | Freq: Every day | ORAL | 0 refills | Status: DC | PRN

## 2018-11-15 NOTE — Telephone Encounter (Signed)
He needs to avoid excessive sugary drinks and foods. I will call in another RX, but all of these medications tend to be expensive.

## 2018-11-15 NOTE — Telephone Encounter (Signed)
Pt aware.

## 2018-11-16 ENCOUNTER — Telehealth: Payer: Self-pay | Admitting: Family Medicine

## 2018-11-16 NOTE — Telephone Encounter (Signed)
Pt has called back states that he didn't have one of his cards on file at the pharmacy but does now so he is good with getting his rx

## 2018-11-30 ENCOUNTER — Telehealth: Payer: Self-pay | Admitting: Family Medicine

## 2019-02-02 ENCOUNTER — Ambulatory Visit (INDEPENDENT_AMBULATORY_CARE_PROVIDER_SITE_OTHER): Payer: BC Managed Care – PPO | Admitting: Family

## 2019-02-02 ENCOUNTER — Encounter: Payer: Self-pay | Admitting: Family

## 2019-02-02 NOTE — Progress Notes (Signed)
Pt has already been seen at the Urgent Care.

## 2019-05-01 DIAGNOSIS — Z029 Encounter for administrative examinations, unspecified: Secondary | ICD-10-CM

## 2019-05-14 ENCOUNTER — Ambulatory Visit (INDEPENDENT_AMBULATORY_CARE_PROVIDER_SITE_OTHER): Payer: BC Managed Care – PPO | Admitting: Family Medicine

## 2019-05-14 ENCOUNTER — Encounter: Payer: Self-pay | Admitting: Family Medicine

## 2019-05-14 VITALS — BP 135/79 | HR 82 | Temp 98.7°F | Resp 20 | Ht 72.0 in | Wt 273.0 lb

## 2019-05-14 DIAGNOSIS — E782 Mixed hyperlipidemia: Secondary | ICD-10-CM | POA: Diagnosis not present

## 2019-05-14 DIAGNOSIS — I10 Essential (primary) hypertension: Secondary | ICD-10-CM | POA: Diagnosis not present

## 2019-05-14 DIAGNOSIS — Z23 Encounter for immunization: Secondary | ICD-10-CM

## 2019-05-14 DIAGNOSIS — E669 Obesity, unspecified: Secondary | ICD-10-CM | POA: Diagnosis not present

## 2019-05-14 NOTE — Patient Instructions (Signed)

## 2019-05-14 NOTE — Progress Notes (Signed)
Subjective:  Logan Smith is a 48 yo male who presents for a follow up for hypertension. He reports is has been doing well and has no concerns today.    Patient here for follow-up of elevated blood pressure.  He is exercising and is adherent to a low-salt diet.  Blood pressure is well controlled at home. Cardiac symptoms: none. Patient denies: chest pain, chest pressure/discomfort, claudication, dyspnea, exertional chest pressure/discomfort, fatigue, irregular heart beat, lower extremity edema, near-syncope, palpitations, paroxysmal nocturnal dyspnea, syncope and tachypnea. Cardiovascular risk factors: advanced age (older than 84 for men, 83 for women), diabetes mellitus, dyslipidemia, hypertension, male gender and obesity (BMI >= 30 kg/m2). Use of agents associated with hypertension: none. History of target organ damage: none.  Logan Smith is trying to lose weight. He is working to improve his diet. He does exercise and stays active throughout the day at work.   The following portions of the patient's history were reviewed and updated as appropriate: allergies, past social history, past surgical history and problem list.   Review of Systems  Constitutional: Negative for diaphoresis, fatigue and unexpected weight change.  Eyes: Negative for visual disturbance.  Respiratory: Negative for chest tightness and shortness of breath.   Cardiovascular: Negative for chest pain, palpitations and leg swelling.  Gastrointestinal: Negative for nausea and vomiting.  Genitourinary: Negative for decreased urine volume and difficulty urinating.  Musculoskeletal: Negative for arthralgias and myalgias.  Skin: Negative for rash and wound.  Neurological: Negative for dizziness, syncope and headaches.  Psychiatric/Behavioral: Negative for dysphoric mood.      Objective:    General appearance: alert, cooperative, no distress and moderately obese Head: Normocephalic, without obvious abnormality, atraumatic  Eyes: conjunctivae/corneas clear. PERRL, EOM's intact Neck: no adenopathy, no carotid bruit, supple, symmetrical, trachea midline and thyroid not enlarged, symmetric, no tenderness/mass/nodules Lungs: clear to auscultation bilaterally. No respiratory distress. Heart: regular rate and rhythm, S1, S2 normal, no murmur, click, rub or gallop Abdomen: soft, active bowel sounds Extremities: extremities normal, atraumatic, no cyanosis or edema Skin: Skin color, texture, turgor normal. No rashes or lesions Neurologic: Mental status: Alert, oriented, thought content appropriate Gait: Normal    Assessment and Plan:   [name] was seen today for medical management of chronic issues and hypertension.  Diagnoses and all orders for this visit:  Essential hypertension BP fairly controlled. Changes were not made in regimen today. Goal BP is 130/80. [name] does not have a BP monitor at home, discussed checking BP at local pharmacy when able. Pt aware to report any persistent high or low readings. DASH diet and exercise encouraged. Exercise at least 150 minutes per week and increase as tolerated. Goal BMI < 25. Stress management encouraged. Avoid nicotine and tobacco product use. Avoid excessive alcohol and NSAID's. Avoid more than 2000 mg of sodium daily. Medications as prescribed. Follow up as scheduled.    Mixed hyperlipidemia Diet encouraged - increase intake of fresh fruits and vegetables, increase intake of lean proteins. Bake, broil, or grill foods. Avoid fried, greasy, and fatty foods. Avoid fast foods. Increase intake of fiber-rich whole grains. Exercise encouraged - at least 150 minutes per week and advance as tolerated.  Goal BMI < 25. Continue medications as prescribed. Follow up in 3-6 months as discussed.    Obesity (BMI 30-39.9) Dash diet education provided. Exercise encouraged - at least 150 minutes per week and advance as tolerated.   Tdap updated today.  The above assessment and management  plan was discussed with the patient. The  patient verbalized understanding of and has agreed to the management plan. Patient is aware to call the clinic if they develop any new symptoms or if symptoms fail to improve or worsen. Patient is aware when to return to the clinic for a follow-up visit. Patient educated on when it is appropriate to go to the emergency department.    [name], RN, FNP student [facility] [address] [place], [place] [address] [phone]   I personally was present during the history, physical exam, and medical decision-making activities of this service and have verified that the service and findings are accurately documented in the nurse practitioner student's note.  Monia Pouch, FNP-C [facility] [address] [place], [place] [address] [phone]

## 2019-05-16 ENCOUNTER — Other Ambulatory Visit: Payer: Self-pay | Admitting: Family Medicine

## 2019-05-16 DIAGNOSIS — E782 Mixed hyperlipidemia: Secondary | ICD-10-CM

## 2019-05-24 ENCOUNTER — Other Ambulatory Visit: Payer: Self-pay

## 2019-05-25 ENCOUNTER — Encounter: Payer: Self-pay | Admitting: Family Medicine

## 2019-05-25 ENCOUNTER — Ambulatory Visit (INDEPENDENT_AMBULATORY_CARE_PROVIDER_SITE_OTHER): Payer: BC Managed Care – PPO | Admitting: Family Medicine

## 2019-05-25 VITALS — BP 145/86 | HR 88 | Temp 98.6°F | Resp 20 | Ht 72.0 in | Wt 271.0 lb

## 2019-05-25 DIAGNOSIS — M62838 Other muscle spasm: Secondary | ICD-10-CM

## 2019-05-25 DIAGNOSIS — M545 Low back pain, unspecified: Secondary | ICD-10-CM

## 2019-05-25 MED ORDER — CYCLOBENZAPRINE HCL 5 MG PO TABS: 5.0000 mg | ORAL_TABLET | Freq: Three times a day (TID) | ORAL | 0 refills | Status: AC | PRN

## 2019-05-25 MED ORDER — PREDNISONE 20 MG PO TABS: ORAL_TABLET | ORAL | 0 refills | Status: DC

## 2019-05-25 NOTE — Patient Instructions (Signed)
Acute Back Pain, Adult Acute back pain is sudden and usually short-lived. It is often caused by an injury to the muscles and tissues in the back. The injury may result from:  A muscle or ligament getting overstretched or torn (strained). Ligaments are tissues that connect bones to each other. Lifting something improperly can cause a back strain.  Wear and tear (degeneration) of the spinal disks. Spinal disks are circular tissue that provides cushioning between the bones of the spine (vertebrae).  Twisting motions, such as while playing sports or doing yard work.  A hit to the back.  Arthritis. You may have a physical exam, lab tests, and imaging tests to find the cause of your pain. Acute back pain usually goes away with rest and home care. Follow these instructions at home: Managing pain, stiffness, and swelling  Take over-the-counter and prescription medicines only as told by your health care provider.  Your health care provider may recommend applying ice during the first 24-48 hours after your pain starts. To do this: ? Put ice in a plastic bag. ? Place a towel between your skin and the bag. ? Leave the ice on for 20 minutes, 2-3 times a day.  If directed, apply heat to the affected area as often as told by your health care provider. Use the heat source that your health care provider recommends, such as a moist heat pack or a heating pad. ? Place a towel between your skin and the heat source. ? Leave the heat on for 20-30 minutes. ? Remove the heat if your skin turns bright red. This is especially important if you are unable to feel pain, heat, or cold. You have a greater risk of getting burned. Activity   Do not stay in bed. Staying in bed for more than 1-2 days can delay your recovery.  Sit up and stand up straight. Avoid leaning forward when you sit, or hunching over when you stand. ? If you work at a desk, sit close to it so you do not need to lean over. Keep your chin tucked  in. Keep your neck drawn back, and keep your elbows bent at a right angle. Your arms should look like the letter "L." ? Sit high and close to the steering wheel when you drive. Add lower back (lumbar) support to your car seat, if needed.  Take short walks on even surfaces as soon as you are able. Try to increase the length of time you walk each day.  Do not sit, drive, or stand in one place for more than 30 minutes at a time. Sitting or standing for long periods of time can put stress on your back.  Do not drive or use heavy machinery while taking prescription pain medicine.  Use proper lifting techniques. When you bend and lift, use positions that put less stress on your back: ? Bend your knees. ? Keep the load close to your body. ? Avoid twisting.  Exercise regularly as told by your health care provider. Exercising helps your back heal faster and helps prevent back injuries by keeping muscles strong and flexible.  Work with a physical therapist to make a safe exercise program, as recommended by your health care provider. Do any exercises as told by your physical therapist. Lifestyle  Maintain a healthy weight. Extra weight puts stress on your back and makes it difficult to have good posture.  Avoid activities or situations that make you feel anxious or stressed. Stress and anxiety increase muscle   tension and can make back pain worse. Learn ways to manage anxiety and stress, such as through exercise. General instructions  Sleep on a firm mattress in a comfortable position. Try lying on your side with your knees slightly bent. If you lie on your back, put a pillow under your knees.  Follow your treatment plan as told by your health care provider. This may include: ? Cognitive or behavioral therapy. ? Acupuncture or massage therapy. ? Meditation or yoga. Contact a health care provider if:  You have pain that is not relieved with rest or medicine.  You have increasing pain going down  into your legs or buttocks.  Your pain does not improve after 2 weeks.  You have pain at night.  You lose weight without trying.  You have a fever or chills. Get help right away if:  You develop new bowel or bladder control problems.  You have unusual weakness or numbness in your arms or legs.  You develop nausea or vomiting.  You develop abdominal pain.  You feel faint. Summary  Acute back pain is sudden and usually short-lived.  Use proper lifting techniques. When you bend and lift, use positions that put less stress on your back.  Take over-the-counter and prescription medicines and apply heat or ice as directed by your health care provider. This information is not intended to replace advice given to you by your health care provider. Make sure you discuss any questions you have with your health care provider. Document Released: 06/28/2005 Document Revised: 10/17/2018 Document Reviewed: 02/09/2017 Elsevier Patient Education  2020 [employer]  

## 2019-05-25 NOTE — Progress Notes (Signed)
Subjective: CC: back pain PCP: Baruch Gouty, FNP HPI: Patient is a 48 y.o. male presenting to clinic today for back pain. Concerns today include:  1. Back Pain Patient reports that pain began 4 days ago.  No prior h/o back pain.  Pain is a 6/10.  It does not radiate.  Bending and lifting heavy objects worsens pain.  Rest and ibuprofen improves pain.  Patient has been taking ibuprofen for pain with some relief.  Patient denies trauma or injury. States his back started hurting after working and lifting objects all day.  Denies dysuria, hematuria, fevers, chills, nausea, vomiting, abdominal pain, renal stones.   No saddle anesthesia, urinary retention/incontinence, bowel incontinence, weakness, falls, sensation changes or pain anywhere else. No prior h/o back surgeries.   Current Outpatient Medications:  .  amLODipine (NORVASC) 5 MG tablet, Take 1 tablet (5 mg total) by mouth daily., Disp: 90 tablet, Rfl: 1 .  benazepril-hydrochlorthiazide (LOTENSIN HCT) 10-12.5 MG tablet, Take 1 tablet by mouth daily., Disp: 90 tablet, Rfl: 3 .  fish oil-omega-3 fatty acids 1000 MG capsule, Take 2 g by mouth daily., Disp: , Rfl:  .  Multiple Vitamins-Minerals (MENS 50+ MULTI VITAMIN/MIN PO), Take by mouth., Disp: , Rfl:  .  rosuvastatin (CRESTOR) 10 MG tablet, TAKE 1 TABLET BY MOUTH EVERY DAY, Disp: 90 tablet, Rfl: 1 .  sildenafil (VIAGRA) 25 MG tablet, Take 1 tablet (25 mg total) by mouth daily as needed for erectile dysfunction., Disp: 10 tablet, Rfl: 0 .  cyclobenzaprine (FLEXERIL) 5 MG tablet, Take 1 tablet (5 mg total) by mouth 3 (three) times daily as needed for up to 10 days for muscle spasms., Disp: 30 tablet, Rfl: 0 .  predniSONE (DELTASONE) 20 MG tablet, 2 po at sametime daily for 5 days, Disp: 10 tablet, Rfl: 0 No Known Allergies  Past Medical History:  Diagnosis Date  . Back pain   . Erectile dysfunction   . Hyperlipidemia   . Hypertension    Social History   Socioeconomic History  .  Marital status: Single    Spouse name: Not on file  . Number of children: Not on file  . Years of education: Not on file  . Highest education level: Not on file  Occupational History  . Not on file  Social Needs  . Financial resource strain: Not on file  . Food insecurity    Worry: Not on file    Inability: Not on file  . Transportation needs    Medical: Not on file    Non-medical: Not on file  Tobacco Use  . Smoking status: Never Smoker  . Smokeless tobacco: Never Used  Substance and Sexual Activity  . Alcohol use: No    Comment: rarely  . Drug use: No  . Sexual activity: Not on file  Lifestyle  . Physical activity    Days per week: Not on file    Minutes per session: Not on file  . Stress: Not on file  Relationships  . Social Herbalist on phone: Not on file    Gets together: Not on file    Attends religious service: Not on file    Active member of club or organization: Not on file    Attends meetings of clubs or organizations: Not on file    Relationship status: Not on file  . Intimate partner violence    Fear of current or ex partner: Not on file    Emotionally abused:  Not on file    Physically abused: Not on file    Forced sexual activity: Not on file  Other Topics Concern  . Not on file  Social History Narrative  . Not on file   Past Surgical History:  Procedure Laterality Date  . HEMORROIDECTOMY    . VASECTOMY      Review of Systems  Constitutional: Negative for chills, diaphoresis, fever, malaise/fatigue and weight loss.  Eyes: Negative for blurred vision and double vision.  Respiratory: Negative for cough and sputum production.   Cardiovascular: Negative for chest pain, palpitations, orthopnea, claudication, leg swelling and PND.  Gastrointestinal: Negative for abdominal pain, blood in stool, constipation, diarrhea and melena.  Genitourinary: Negative for dysuria, flank pain, frequency, hematuria and urgency.  Musculoskeletal: Positive for  back pain and myalgias. Negative for falls, joint pain and neck pain.  Neurological: Negative for dizziness, tingling, sensory change, focal weakness and weakness.  All other systems reviewed and are negative.    Objective: Office vital signs reviewed. BP (!) 145/86 (BP Location: Right Arm, Cuff Size: Large)   Pulse 88   Temp 98.6 F (37 C)   Resp 20   Ht 6' (1.829 m)   Wt 271 lb (122.9 kg)   SpO2 98%   BMI 36.75 kg/m   Physical Examination:  General: Awake, alert, oriented nourished, NAD Cardio: Regular rate and rhythm, S1S2 heard, no murmurs appreciated Pulm: Clear to auscultation bilaterally, no wheezes, rhonchi or rales Extremities: Warm, well-perfused. No edema, cyanosis or clubbing; +2 pulses bilaterally MSK: normal gait and station  Lumbar Spine: full AROM, Mild midline tenderness to palpation, moderate paraspinal tenderness to palpation.  No palpable bony deformities,  Negative straight leg test Neuro: 5/5 lower extremity strength; lower extremity light touch sensation grossly intact, normal heel walk, Toe Walk and Tandem Walk  Assessment/ Plan: Logan Smith was seen today for back pain.  Diagnoses and all orders for this visit:  Acute midline low back pain without sciatica No red flags present. Symptomatic care discussed in detail. Medications as prescribed. Avoid NSAIDs due to HTN. Can take tylenol as needed. Follow up for new, worsening, or persistent symptoms.  -     cyclobenzaprine (FLEXERIL) 5 MG tablet; Take 1 tablet (5 mg total) by mouth 3 (three) times daily as needed for up to 10 days for muscle spasms. -     predniSONE (DELTASONE) 20 MG tablet; 2 po at sametime daily for 5 days  Muscle spasm Symptomatic care discussed. Medications as prescribed. Report any new or worsening symptoms.  -     cyclobenzaprine (FLEXERIL) 5 MG tablet; Take 1 tablet (5 mg total) by mouth 3 (three) times daily as needed for up to 10 days for muscle spasms. -     predniSONE (DELTASONE) 20  MG tablet; 2 po at sametime daily for 5 days  Blood pressure elevated in office but pt in pain during todays visit. Pt will monitor at home and report any persistent high readings.   Return if symptoms worsen or fail to improve.   The above assessment and management plan was discussed with the patient. The patient verbalized understanding of and has agreed to the management plan. Patient is aware to call the clinic if symptoms persist or worsen. Patient is aware when to return to the clinic for a follow-up visit. Patient educated on when it is appropriate to go to the emergency department.   Monia Pouch, FNP-C [facility] [address] [place], [place] [address] [phone]  [phone]    

## 2019-05-31 ENCOUNTER — Ambulatory Visit (INDEPENDENT_AMBULATORY_CARE_PROVIDER_SITE_OTHER): Payer: BC Managed Care – PPO | Admitting: Family

## 2019-05-31 ENCOUNTER — Other Ambulatory Visit: Payer: Self-pay

## 2019-05-31 ENCOUNTER — Encounter: Payer: Self-pay | Admitting: Family

## 2019-05-31 ENCOUNTER — Ambulatory Visit (INDEPENDENT_AMBULATORY_CARE_PROVIDER_SITE_OTHER): Payer: BC Managed Care – PPO

## 2019-05-31 VITALS — BP 131/78 | HR 80 | Temp 96.0°F | Ht 72.0 in | Wt 273.2 lb

## 2019-05-31 DIAGNOSIS — M5442 Lumbago with sciatica, left side: Secondary | ICD-10-CM

## 2019-05-31 DIAGNOSIS — E669 Obesity, unspecified: Secondary | ICD-10-CM | POA: Diagnosis not present

## 2019-05-31 MED ORDER — DICLOFENAC SODIUM 75 MG PO TBEC: 75.0000 mg | DELAYED_RELEASE_TABLET | Freq: Two times a day (BID) | ORAL | 0 refills | Status: DC

## 2019-05-31 MED ORDER — GABAPENTIN 100 MG PO CAPS: 100.0000 mg | ORAL_CAPSULE | Freq: Three times a day (TID) | ORAL | 0 refills | Status: DC

## 2019-05-31 MED ORDER — KETOROLAC TROMETHAMINE 60 MG/2ML IM SOLN
60.0000 mg | Freq: Once | INTRAMUSCULAR | Status: AC
  Administered 2019-05-31: 60 mg via INTRAMUSCULAR

## 2019-05-31 MED ORDER — METHYLPREDNISOLONE ACETATE 80 MG/ML IJ SUSP
80.0000 mg | Freq: Once | INTRAMUSCULAR | Status: AC
  Administered 2019-05-31: 80 mg via INTRAMUSCULAR

## 2019-05-31 NOTE — Progress Notes (Addendum)
Subjective:    Patient ID: Logan Smith, male    DOB: 03-19-1971, 48 y.o.   MRN: [id number]  Chief Complaint  Patient presents with  . Back Pain    back pain started this morning   Pt presents to the office today with lower left back pain. 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 He was seen on 05/25/19 and given Flexeril and prednisone.He reports he just started these medications over the last 2 days, but his pain is worse today.   Back Pain This is a new problem. The current episode started in the past 7 days. The problem occurs constantly. The problem is unchanged. The pain is present in the lumbar spine. The quality of the pain is described as aching. The pain radiates to the left thigh. The pain is at a severity of 8/10. The pain is moderate. The symptoms are aggravated by twisting and standing. Associated symptoms include leg pain, tingling and weakness. Pertinent negatives include no bladder incontinence or bowel incontinence. He has tried muscle relaxant for the symptoms. The treatment provided mild relief.      Review of Systems  Gastrointestinal: Negative for bowel incontinence.  Genitourinary: Negative for bladder incontinence.  Musculoskeletal: Positive for back pain.  Neurological: Positive for tingling and weakness.  All other systems reviewed and are negative.      Objective:   Physical Exam Vitals signs reviewed.  Constitutional:      General: He is not in acute distress.    Appearance: He is well-developed.  HENT:     Head: Normocephalic.  Eyes:     General:        Right eye: No discharge.        Left eye: No discharge.     Pupils: Pupils are equal, round, and reactive to light.  Neck:     Musculoskeletal: Normal range of motion and neck supple.     Thyroid: No thyromegaly.  Cardiovascular:     Rate and Rhythm: Normal rate and regular rhythm.     Heart sounds: Normal heart sounds. No murmur.  Pulmonary:     Effort: Pulmonary effort is  normal. No respiratory distress.     Breath sounds: Normal breath sounds. No wheezing.  Abdominal:     General: Bowel sounds are normal. There is no distension.     Palpations: Abdomen is soft.     Tenderness: There is no abdominal tenderness.  Musculoskeletal:        General: Tenderness present.     Comments: Pain in lower left lumbar, unable to do SLR because he states laying flat hurts too much. Pain in lumbar with flexion or extension  Skin:    General: Skin is warm and dry.     Findings: No erythema or rash.  Neurological:     Mental Status: He is alert and oriented to person, place, and time.     Cranial Nerves: No cranial nerve deficit.     Deep Tendon Reflexes: Reflexes are normal and symmetric.  Psychiatric:        Behavior: Behavior normal.        Thought Content: Thought content normal.        Judgment: Judgment normal.      BP 131/78   Pulse 80   Temp (!) 96 F (35.6 C) (Temporal)   Ht 6' (1.829 m)   Wt 273 lb 3.2 oz (123.9 kg)   SpO2 100%   BMI 37.05 kg/m  Assessment & Plan:  Smaran Bee comes in today with chief complaint of Back Pain (back pain started this morning)   Diagnosis and orders addressed:  1. Acute left-sided low back pain with left-sided sciatica Rest Continue Prednisone and flexeril at home Ice ROM exercises encouraged Sedation precautions discussed  RTO if symptoms worsen or do not improve  - methylPREDNISolone acetate (DEPO-MEDROL) injection 80 mg - ketorolac (TORADOL) injection 60 mg - gabapentin (NEURONTIN) 100 MG capsule; Take 1 capsule (100 mg total) by mouth 3 (three) times daily.  Dispense: 60 capsule; Refill: 0 - diclofenac (VOLTAREN) 75 MG EC tablet; Take 1 tablet (75 mg total) by mouth 2 (two) times daily.  Dispense: 30 tablet; Refill: 0 - DG Lumbar Spine 2-3 Views; Future   2. Obesity (BMI 30-39.9)    Evelina Dun, FNP

## 2019-05-31 NOTE — Addendum Note (Signed)
Addended by: Evelina Dun A on: 05/31/2019 01:11 PM   Modules accepted: Orders, Level of Service

## 2019-05-31 NOTE — Patient Instructions (Signed)
Acute Back Pain, Adult Acute back pain is sudden and usually short-lived. It is often caused by an injury to the muscles and tissues in the back. The injury may result from:  A muscle or ligament getting overstretched or torn (strained). Ligaments are tissues that connect bones to each other. Lifting something improperly can cause a back strain.  Wear and tear (degeneration) of the spinal disks. Spinal disks are circular tissue that provides cushioning between the bones of the spine (vertebrae).  Twisting motions, such as while playing sports or doing yard work.  A hit to the back.  Arthritis. You may have a physical exam, lab tests, and imaging tests to find the cause of your pain. Acute back pain usually goes away with rest and home care. Follow these instructions at home: Managing pain, stiffness, and swelling  Take over-the-counter and prescription medicines only as told by your health care provider.  Your health care provider may recommend applying ice during the first 24-48 hours after your pain starts. To do this: ? Put ice in a plastic bag. ? Place a towel between your skin and the bag. ? Leave the ice on for 20 minutes, 2-3 times a day.  If directed, apply heat to the affected area as often as told by your health care provider. Use the heat source that your health care provider recommends, such as a moist heat pack or a heating pad. ? Place a towel between your skin and the heat source. ? Leave the heat on for 20-30 minutes. ? Remove the heat if your skin turns bright red. This is especially important if you are unable to feel pain, heat, or cold. You have a greater risk of getting burned. Activity   Do not stay in bed. Staying in bed for more than 1-2 days can delay your recovery.  Sit up and stand up straight. Avoid leaning forward when you sit, or hunching over when you stand. ? If you work at a desk, sit close to it so you do not need to lean over. Keep your chin tucked  in. Keep your neck drawn back, and keep your elbows bent at a right angle. Your arms should look like the letter "L." ? Sit high and close to the steering wheel when you drive. Add lower back (lumbar) support to your car seat, if needed.  Take short walks on even surfaces as soon as you are able. Try to increase the length of time you walk each day.  Do not sit, drive, or stand in one place for more than 30 minutes at a time. Sitting or standing for long periods of time can put stress on your back.  Do not drive or use heavy machinery while taking prescription pain medicine.  Use proper lifting techniques. When you bend and lift, use positions that put less stress on your back: ? Bend your knees. ? Keep the load close to your body. ? Avoid twisting.  Exercise regularly as told by your health care provider. Exercising helps your back heal faster and helps prevent back injuries by keeping muscles strong and flexible.  Work with a physical therapist to make a safe exercise program, as recommended by your health care provider. Do any exercises as told by your physical therapist. Lifestyle  Maintain a healthy weight. Extra weight puts stress on your back and makes it difficult to have good posture.  Avoid activities or situations that make you feel anxious or stressed. Stress and anxiety increase muscle   tension and can make back pain worse. Learn ways to manage anxiety and stress, such as through exercise. General instructions  Sleep on a firm mattress in a comfortable position. Try lying on your side with your knees slightly bent. If you lie on your back, put a pillow under your knees.  Follow your treatment plan as told by your health care provider. This may include: ? Cognitive or behavioral therapy. ? Acupuncture or massage therapy. ? Meditation or yoga. Contact a health care provider if:  You have pain that is not relieved with rest or medicine.  You have increasing pain going down  into your legs or buttocks.  Your pain does not improve after 2 weeks.  You have pain at night.  You lose weight without trying.  You have a fever or chills. Get help right away if:  You develop new bowel or bladder control problems.  You have unusual weakness or numbness in your arms or legs.  You develop nausea or vomiting.  You develop abdominal pain.  You feel faint. Summary  Acute back pain is sudden and usually short-lived.  Use proper lifting techniques. When you bend and lift, use positions that put less stress on your back.  Take over-the-counter and prescription medicines and apply heat or ice as directed by your health care provider. This information is not intended to replace advice given to you by your health care provider. Make sure you discuss any questions you have with your health care provider. Document Released: 06/28/2005 Document Revised: 10/17/2018 Document Reviewed: 02/09/2017 Elsevier Patient Education  2020 [employer]  

## 2019-06-04 ENCOUNTER — Telehealth: Payer: Self-pay | Admitting: Family Medicine

## 2019-06-04 DIAGNOSIS — M5430 Sciatica, unspecified side: Secondary | ICD-10-CM

## 2019-06-04 NOTE — Telephone Encounter (Signed)
This is likely sciatica. If he has weakness or loss of function in his leg, he needs to be seen. If he has numbness to his groin area or loss of bowel or bladder function. he needs to go to the ED. This is likely sciatica and if none of the above are present, we can place him on oral steroids.

## 2019-06-04 NOTE — Telephone Encounter (Signed)
I have placed a referral for PT. This numbness should go away. Continue rest, ice, ROM exercises. Avoid reinjury/flare up.

## 2019-06-04 NOTE — Telephone Encounter (Signed)
Please review and advise.

## 2019-06-04 NOTE — Telephone Encounter (Signed)
Aware of provider advice.

## 2019-06-04 NOTE — Telephone Encounter (Signed)
Patient seen Logan Smith 11/19- please advise.

## 2019-06-04 NOTE — Telephone Encounter (Signed)
lmtcb

## 2019-06-05 ENCOUNTER — Telehealth: Payer: Self-pay | Admitting: Family Medicine

## 2019-06-05 NOTE — Telephone Encounter (Signed)
LMOVM ppw has been refaxed

## 2019-06-05 NOTE — Telephone Encounter (Signed)
Patient aware and verbalizes understanding. 

## 2019-06-05 NOTE — Telephone Encounter (Signed)
Refer to lab results.  

## 2019-08-16 ENCOUNTER — Other Ambulatory Visit: Payer: Self-pay | Admitting: Family Medicine

## 2019-08-16 DIAGNOSIS — I10 Essential (primary) hypertension: Secondary | ICD-10-CM

## 2019-10-01 ENCOUNTER — Other Ambulatory Visit: Payer: Self-pay

## 2019-10-01 ENCOUNTER — Ambulatory Visit (INDEPENDENT_AMBULATORY_CARE_PROVIDER_SITE_OTHER): Payer: BC Managed Care – PPO | Admitting: Family Medicine

## 2019-10-01 ENCOUNTER — Encounter: Payer: Self-pay | Admitting: Family Medicine

## 2019-10-01 VITALS — BP 121/78 | HR 89 | Temp 99.0°F | Resp 20 | Ht 72.0 in | Wt 275.0 lb

## 2019-10-01 DIAGNOSIS — K648 Other hemorrhoids: Secondary | ICD-10-CM

## 2019-10-01 MED ORDER — HYDROCORTISONE ACETATE 25 MG RE SUPP: 25.0000 mg | Freq: Two times a day (BID) | RECTAL | 0 refills | Status: DC

## 2019-10-01 NOTE — Progress Notes (Signed)
Subjective:  Patient ID: Logan Smith, male    DOB: Mar 31, 1971, 49 y.o.   MRN: [id number]  Patient Care Team: Baruch Gouty, FNP as PCP - General (Family Medicine) Chevis Pretty, FNP as Nurse Practitioner (Nurse Practitioner)   Chief Complaint:  Personal Problem (anal itching  ( no discharge ) )   HPI: Christophere Smith is a 49 y.o. male presenting on 10/01/2019 for Personal Problem (anal itching  ( no discharge ) )    Pt reports anal itching for several weeks. States he has noticed some bright red streaking in his stool at times. States he has not noticed any discharge or external lesions. States he has tried to keep his stools soft.   Relevant past medical, surgical, family, and social history reviewed and updated as indicated.  Allergies and medications reviewed and updated. Date reviewed: Chart in Epic.   Past Medical History:  Diagnosis Date  . Back pain   . Erectile dysfunction   . Hyperlipidemia   . Hypertension     Past Surgical History:  Procedure Laterality Date  . HEMORROIDECTOMY    . VASECTOMY      Social History   Socioeconomic History  . Marital status: Single    Spouse name: Not on file  . Number of children: Not on file  . Years of education: Not on file  . Highest education level: Not on file  Occupational History  . Not on file  Tobacco Use  . Smoking status: Never Smoker  . Smokeless tobacco: Never Used  Substance and Sexual Activity  . Alcohol use: No    Comment: rarely  . Drug use: No  . Sexual activity: Not on file  Other Topics Concern  . Not on file  Social History Narrative  . Not on file   Social Determinants of Health   Financial Resource Strain:   . Difficulty of Paying Living Expenses:   Food Insecurity:   . Worried About Charity fundraiser in the Last Year:   . Arboriculturist in the Last Year:   Transportation Needs:   . Film/video editor (Medical):   Marland Kitchen Lack of Transportation (Non-Medical):   Physical  Activity:   . Days of Exercise per Week:   . Minutes of Exercise per Session:   Stress:   . Feeling of Stress :   Social Connections:   . Frequency of Communication with Friends and Family:   . Frequency of Social Gatherings with Friends and Family:   . Attends Religious Services:   . Active Member of Clubs or Organizations:   . Attends Archivist Meetings:   Marland Kitchen Marital Status:   Intimate Partner Violence:   . Fear of Current or Ex-Partner:   . Emotionally Abused:   Marland Kitchen Physically Abused:   . Sexually Abused:     Outpatient Encounter Medications as of 10/01/2019  Medication Sig  . amLODipine (NORVASC) 5 MG tablet TAKE 1 TABLET BY MOUTH EVERY DAY  . benazepril-hydrochlorthiazide (LOTENSIN HCT) 10-12.5 MG tablet Take 1 tablet by mouth daily.  . diclofenac (VOLTAREN) 75 MG EC tablet Take 1 tablet (75 mg total) by mouth 2 (two) times daily.  . fish oil-omega-3 fatty acids 1000 MG capsule Take 2 g by mouth daily.  Marland Kitchen gabapentin (NEURONTIN) 100 MG capsule Take 1 capsule (100 mg total) by mouth 3 (three) times daily.  . Multiple Vitamins-Minerals (MENS 50+ MULTI VITAMIN/MIN PO) Take by mouth.  . rosuvastatin (CRESTOR) 10  MG tablet TAKE 1 TABLET BY MOUTH EVERY DAY  . sildenafil (VIAGRA) 25 MG tablet Take 1 tablet (25 mg total) by mouth daily as needed for erectile dysfunction.  . hydrocortisone (ANUSOL-HC) 25 MG suppository Place 1 suppository (25 mg total) rectally 2 (two) times daily.  . [DISCONTINUED] predniSONE (DELTASONE) 20 MG tablet 2 po at sametime daily for 5 days   No facility-administered encounter medications on file as of 10/01/2019.    No Known Allergies  Review of Systems  Constitutional: Negative for activity change, appetite change, chills, diaphoresis, fatigue, fever and unexpected weight change.  HENT: Negative.   Eyes: Negative.  Negative for photophobia and visual disturbance.  Respiratory: Negative for cough, chest tightness and shortness of breath.     Cardiovascular: Negative for chest pain, palpitations and leg swelling.  Gastrointestinal: Positive for anal bleeding and rectal pain. Negative for abdominal distention, abdominal pain, blood in stool, constipation, diarrhea, nausea and vomiting.  Endocrine: Negative.   Genitourinary: Negative for decreased urine volume, difficulty urinating, dysuria, frequency and urgency.  Musculoskeletal: Negative for arthralgias and myalgias.  Skin: Negative.   Allergic/Immunologic: Negative.   Neurological: Negative for dizziness, tremors, seizures, syncope, facial asymmetry, speech difficulty, weakness, light-headedness, numbness and headaches.  Hematological: Negative.   Psychiatric/Behavioral: Negative for confusion, hallucinations, sleep disturbance and suicidal ideas.  All other systems reviewed and are negative.       Objective:  BP 121/78 (BP Location: Left Arm, Cuff Size: Large)   Pulse 89   Temp 99 F (37.2 C)   Resp 20   Ht 6' (1.829 m)   Wt 275 lb (124.7 kg)   SpO2 97%   BMI 37.30 kg/m    Wt Readings from Last 3 Encounters:  10/01/19 275 lb (124.7 kg)  05/31/19 273 lb 3.2 oz (123.9 kg)  05/25/19 271 lb (122.9 kg)    Physical Exam Vitals and nursing note reviewed.  Constitutional:      General: He is not in acute distress.    Appearance: Normal appearance. He is well-developed and well-groomed. He is not ill-appearing, toxic-appearing or diaphoretic.  HENT:     Head: Normocephalic and atraumatic.     Jaw: There is normal jaw occlusion.     Right Ear: Hearing normal.     Left Ear: Hearing normal.     Nose: Nose normal.     Mouth/Throat:     Lips: Pink.     Mouth: Mucous membranes are moist.     Pharynx: Oropharynx is clear. Uvula midline.  Eyes:     General: Lids are normal.     Extraocular Movements: Extraocular movements intact.     Conjunctiva/sclera: Conjunctivae normal.     Pupils: Pupils are equal, round, and reactive to light.  Neck:     Thyroid: No thyroid  mass, thyromegaly or thyroid tenderness.     Vascular: No carotid bruit or JVD.     Trachea: Trachea and phonation normal.  Cardiovascular:     Rate and Rhythm: Normal rate and regular rhythm.     Chest Wall: PMI is not displaced.     Pulses: Normal pulses.     Heart sounds: Normal heart sounds. No murmur. No friction rub. No gallop.   Pulmonary:     Effort: Pulmonary effort is normal. No respiratory distress.     Breath sounds: Normal breath sounds. No wheezing.  Abdominal:     General: Bowel sounds are normal. There is no distension or abdominal bruit.  Palpations: Abdomen is soft. There is no hepatomegaly or splenomegaly.     Tenderness: There is no abdominal tenderness. There is no right CVA tenderness or left CVA tenderness.     Hernia: No hernia is present.  Genitourinary:    Rectum: Internal hemorrhoid present. No mass, tenderness, anal fissure or external hemorrhoid. Normal anal tone.  Musculoskeletal:        General: Normal range of motion.     Cervical back: Normal range of motion and neck supple.     Right lower leg: No edema.     Left lower leg: No edema.  Lymphadenopathy:     Cervical: No cervical adenopathy.  Skin:    General: Skin is warm and dry.     Capillary Refill: Capillary refill takes less than 2 seconds.     Coloration: Skin is not cyanotic, jaundiced or pale.     Findings: No rash.  Neurological:     General: No focal deficit present.     Mental Status: He is alert and oriented to person, place, and time.     Cranial Nerves: Cranial nerves are intact. No cranial nerve deficit.     Sensory: Sensation is intact. No sensory deficit.     Motor: Motor function is intact. No weakness.     Coordination: Coordination is intact. Coordination normal.     Gait: Gait is intact. Gait normal.     Deep Tendon Reflexes: Reflexes are normal and symmetric. Reflexes normal.  Psychiatric:        Attention and Perception: Attention and perception normal.        Mood and  Affect: Mood and affect normal.        Speech: Speech normal.        Behavior: Behavior normal. Behavior is cooperative.        Thought Content: Thought content normal.        Cognition and Memory: Cognition and memory normal.        Judgment: Judgment normal.     Results for orders placed or performed in visit on 11/10/18  CMP14+EGFR  Result Value Ref Range   Glucose 126 (H) 65 - 99 mg/dL   BUN 11 6 - 24 mg/dL   Creatinine, Ser 0.95 0.76 - 1.27 mg/dL   GFR calc non Af Amer 96 >59 mL/min/1.73   GFR calc Af Amer 111 >59 mL/min/1.73   BUN/Creatinine Ratio 12 9 - 20   Sodium 140 134 - 144 mmol/L   Potassium 3.7 3.5 - 5.2 mmol/L   Chloride 102 96 - 106 mmol/L   CO2 24 20 - 29 mmol/L   Calcium 9.5 8.7 - 10.2 mg/dL   Total Protein 6.6 6.0 - 8.5 g/dL   Albumin 4.4 4.0 - 5.0 g/dL   Globulin, Total 2.2 1.5 - 4.5 g/dL   Albumin/Globulin Ratio 2.0 1.2 - 2.2   Bilirubin Total 0.5 0.0 - 1.2 mg/dL   Alkaline Phosphatase 76 39 - 117 IU/L   AST 16 0 - 40 IU/L   ALT 22 0 - 44 IU/L  Lipid panel  Result Value Ref Range   Cholesterol, Total 102 100 - 199 mg/dL   Triglycerides 60 0 - 149 mg/dL   HDL 34 (L) >39 mg/dL   VLDL Cholesterol Cal 12 5 - 40 mg/dL   LDL Calculated 56 0 - 99 mg/dL   Chol/HDL Ratio 3.0 0.0 - 5.0 ratio       Pertinent labs & imaging results that were available  during my care of the patient were reviewed by me and considered in my medical decision making.  Assessment & Plan:  Archie was seen today for personal problem.  Diagnoses and all orders for this visit:  Hemorrhoids, internal Symptomatic care discussed in detail. Medications as prescribed. Report any new, worsening, or persistent symptoms.  -     hydrocortisone (ANUSOL-HC) 25 MG suppository; Place 1 suppository (25 mg total) rectally 2 (two) times daily.     Continue all other maintenance medications.  Follow up plan: Return if symptoms worsen or fail to improve.   Medical decision-making:  20  minutes spent today reviewing the medical chart, counseling the patient/family, and documenting today's visit.  Continue healthy lifestyle choices, including diet (rich in fruits, vegetables, and lean proteins, and low in salt and simple carbohydrates) and exercise (at least 30 minutes of moderate physical activity daily).  Educational handout given for hemorrhoids  The above assessment and management plan was discussed with the patient. The patient verbalized understanding of and has agreed to the management plan. Patient is aware to call the clinic if they develop any new symptoms or if symptoms persist or worsen. Patient is aware when to return to the clinic for a follow-up visit. Patient educated on when it is appropriate to go to the emergency department.   Monia Pouch, FNP-C [facility] [phone]

## 2019-10-01 NOTE — Patient Instructions (Signed)
Hemorrhoids Hemorrhoids are swollen veins that may develop:  In the butt (rectum). These are called internal hemorrhoids.  Around the opening of the butt (anus). These are called external hemorrhoids. Hemorrhoids can cause pain, itching, or bleeding. Most of the time, they do not cause serious problems. They usually get better with diet changes, lifestyle changes, and other home treatments. What are the causes? This condition may be caused by:  Having trouble pooping (constipation).  Pushing hard (straining) to poop.  Watery poop (diarrhea).  Pregnancy.  Being very overweight (obese).  Sitting for long periods of time.  Heavy lifting or other activity that causes you to strain.  Anal sex.  Riding a bike for a long period of time. What are the signs or symptoms? Symptoms of this condition include:  Pain.  Itching or soreness in the butt.  Bleeding from the butt.  Leaking poop.  Swelling in the area.  One or more lumps around the opening of your butt. How is this diagnosed? A doctor can often diagnose this condition by looking at the affected area. The doctor may also:  Do an exam that involves feeling the area with a gloved hand (digital rectal exam).  Examine the area inside your butt using a small tube (anoscope).  Order blood tests. This may be done if you have lost a lot of blood.  Have you get a test that involves looking inside the colon using a flexible tube with a camera on the end (sigmoidoscopy or colonoscopy). How is this treated? This condition can usually be treated at home. Your doctor may tell you to change what you eat, make lifestyle changes, or try home treatments. If these do not help, procedures can be done to remove the hemorrhoids or make them smaller. These may involve:  Placing rubber bands at the base of the hemorrhoids to cut off their blood supply.  Injecting medicine into the hemorrhoids to shrink them.  Shining a type of light  energy onto the hemorrhoids to cause them to fall off.  Doing surgery to remove the hemorrhoids or cut off their blood supply. Follow these instructions at home: Eating and drinking   Eat foods that have a lot of fiber in them. These include whole grains, beans, nuts, fruits, and vegetables.  Ask your doctor about taking products that have added fiber (fibersupplements).  Reduce the amount of fat in your diet. You can do this by: ? Eating low-fat dairy products. ? Eating less red meat. ? Avoiding processed foods.  Drink enough fluid to keep your pee (urine) pale yellow. Managing pain and swelling   Take a warm-water bath (sitz bath) for 20 minutes to ease pain. Do this 3-4 times a day. You may do this in a bathtub or using a portable sitz bath that fits over the toilet.  If told, put ice on the painful area. It may be helpful to use ice between your warm baths. ? Put ice in a plastic bag. ? Place a towel between your skin and the bag. ? Leave the ice on for 20 minutes, 2-3 times a day. General instructions  Take over-the-counter and prescription medicines only as told by your doctor. ? Medicated creams and medicines may be used as told.  Exercise often. Ask your doctor how much and what kind of exercise is best for you.  Go to the bathroom when you have the urge to poop. Do not wait.  Avoid pushing too hard when you poop.  Keep your   butt dry and clean. Use wet toilet paper or moist towelettes after pooping.  Do not sit on the toilet for a long time.  Keep all follow-up visits as told by your doctor. This is important. Contact a doctor if you:  Have pain and swelling that do not get better with treatment or medicine.  Have trouble pooping.  Cannot poop.  Have pain or swelling outside the area of the hemorrhoids. Get help right away if you have:  Bleeding that will not stop. Summary  Hemorrhoids are swollen veins in the butt or around the opening of the  butt.  They can cause pain, itching, or bleeding.  Eat foods that have a lot of fiber in them. These include whole grains, beans, nuts, fruits, and vegetables.  Take a warm-water bath (sitz bath) for 20 minutes to ease pain. Do this 3-4 times a day. This information is not intended to replace advice given to you by your health care provider. Make sure you discuss any questions you have with your health care provider. Document Revised: 07/06/2018 Document Reviewed: 11/17/2017 Elsevier Patient Education  2020 Elsevier Inc.  

## 2019-10-02 ENCOUNTER — Other Ambulatory Visit: Payer: Self-pay | Admitting: Family Medicine

## 2019-10-02 ENCOUNTER — Telehealth: Payer: Self-pay | Admitting: Family Medicine

## 2019-10-02 MED ORDER — HYDROCORTISONE ACE-PRAMOXINE 1-1 % EX CREA: 1.0000 "application " | TOPICAL_CREAM | Freq: Two times a day (BID) | CUTANEOUS | 0 refills | Status: AC

## 2019-10-02 NOTE — Telephone Encounter (Signed)
I sent in the requested prescription 

## 2019-10-04 ENCOUNTER — Ambulatory Visit: Payer: Self-pay | Attending: Internal Medicine

## 2019-10-04 ENCOUNTER — Encounter: Payer: Self-pay | Admitting: *Deleted

## 2019-10-04 DIAGNOSIS — Z23 Encounter for immunization: Secondary | ICD-10-CM

## 2019-10-04 NOTE — Progress Notes (Signed)
   Covid-19 Vaccination Clinic  Name:  Logan Smith    MRN: [id number] DOB: Jul 24, 1970  10/04/2019  [name] was observed post Covid-19 immunization for 15 minutes without incident. He was provided with Vaccine Information Sheet and instruction to access the V-Safe system.   [name] was instructed to call 911 with any severe reactions post vaccine: Marland Kitchen Difficulty breathing  . Swelling of face and throat  . A fast heartbeat  . A bad rash all over body  . Dizziness and weakness   Immunizations Administered    Name Date Dose VIS Date Route   Moderna COVID-19 Vaccine 10/04/2019 12:34 PM 0.5 mL 06/12/2019 Intramuscular   Manufacturer: Moderna   Lot: [id number]   [place]:[id number]

## 2019-11-01 ENCOUNTER — Ambulatory Visit: Payer: Self-pay | Attending: Internal Medicine

## 2019-11-01 DIAGNOSIS — Z23 Encounter for immunization: Secondary | ICD-10-CM

## 2019-11-01 NOTE — Progress Notes (Signed)
   Covid-19 Vaccination Clinic  Name:  Hoss Palermo    MRN: [id number] DOB: February 15, 1971  11/01/2019  Mr. Kilday was observed post Covid-19 immunization for 15 minutes without incident. He was provided with Vaccine Information Sheet and instruction to access the V-Safe system.   Mr. Englander was instructed to call 911 with any severe reactions post vaccine: Marland Kitchen Difficulty breathing  . Swelling of face and throat  . A fast heartbeat  . A bad rash all over body  . Dizziness and weakness   Immunizations Administered    Name Date Dose VIS Date Route   Moderna COVID-19 Vaccine 11/01/2019 12:11 PM 0.5 mL 06/2019 Intramuscular   Manufacturer: Levan Hurst   Lot: [id number]   [place]:[id number]

## 2019-11-15 ENCOUNTER — Other Ambulatory Visit: Payer: Self-pay | Admitting: *Deleted

## 2019-11-15 DIAGNOSIS — E782 Mixed hyperlipidemia: Secondary | ICD-10-CM

## 2019-11-15 MED ORDER — ROSUVASTATIN CALCIUM 10 MG PO TABS: 10.0000 mg | ORAL_TABLET | Freq: Every day | ORAL | 0 refills | Status: DC

## 2019-11-16 ENCOUNTER — Other Ambulatory Visit: Payer: Self-pay | Admitting: *Deleted

## 2019-11-16 DIAGNOSIS — I10 Essential (primary) hypertension: Secondary | ICD-10-CM

## 2019-11-16 MED ORDER — BENAZEPRIL-HYDROCHLOROTHIAZIDE 10-12.5 MG PO TABS: 1.0000 | ORAL_TABLET | Freq: Every day | ORAL | 0 refills | Status: DC

## 2019-11-19 ENCOUNTER — Encounter: Payer: BC Managed Care – PPO | Admitting: Nurse Practitioner

## 2019-11-19 ENCOUNTER — Encounter: Payer: BC Managed Care – PPO | Admitting: Family Medicine

## 2019-11-19 ENCOUNTER — Encounter: Payer: BC Managed Care – PPO | Admitting: Family

## 2019-11-27 ENCOUNTER — Other Ambulatory Visit: Payer: Self-pay | Admitting: Family

## 2019-11-27 DIAGNOSIS — I10 Essential (primary) hypertension: Secondary | ICD-10-CM

## 2019-12-20 ENCOUNTER — Other Ambulatory Visit: Payer: Self-pay | Admitting: Nurse Practitioner

## 2019-12-20 DIAGNOSIS — E782 Mixed hyperlipidemia: Secondary | ICD-10-CM

## 2019-12-25 ENCOUNTER — Encounter: Payer: BC Managed Care – PPO | Admitting: Family

## 2019-12-26 ENCOUNTER — Encounter: Payer: Self-pay | Admitting: Family

## 2020-01-14 ENCOUNTER — Other Ambulatory Visit: Payer: Self-pay | Admitting: Nurse Practitioner

## 2020-01-14 DIAGNOSIS — E782 Mixed hyperlipidemia: Secondary | ICD-10-CM

## 2020-01-17 ENCOUNTER — Ambulatory Visit (INDEPENDENT_AMBULATORY_CARE_PROVIDER_SITE_OTHER): Payer: BC Managed Care – PPO | Admitting: Family

## 2020-01-17 ENCOUNTER — Other Ambulatory Visit (HOSPITAL_COMMUNITY)
Admission: RE | Admit: 2020-01-17 | Discharge: 2020-01-17 | Disposition: A | Discharge status: Home / Self Care | Payer: BC Managed Care – PPO | Source: Ambulatory Visit | Attending: Family | Admitting: Family

## 2020-01-17 ENCOUNTER — Encounter: Payer: Self-pay | Admitting: Family

## 2020-01-17 ENCOUNTER — Other Ambulatory Visit: Payer: Self-pay

## 2020-01-17 VITALS — BP 129/84 | HR 94 | Temp 97.7°F | Ht 72.0 in | Wt 286.6 lb

## 2020-01-17 DIAGNOSIS — L290 Pruritus ani: Secondary | ICD-10-CM | POA: Diagnosis not present

## 2020-01-17 DIAGNOSIS — I10 Essential (primary) hypertension: Secondary | ICD-10-CM | POA: Diagnosis not present

## 2020-01-17 DIAGNOSIS — Z0000 Encounter for general adult medical examination without abnormal findings: Secondary | ICD-10-CM

## 2020-01-17 DIAGNOSIS — M5442 Lumbago with sciatica, left side: Secondary | ICD-10-CM | POA: Diagnosis not present

## 2020-01-17 DIAGNOSIS — E669 Obesity, unspecified: Secondary | ICD-10-CM | POA: Diagnosis not present

## 2020-01-17 DIAGNOSIS — Z1159 Encounter for screening for other viral diseases: Secondary | ICD-10-CM

## 2020-01-17 DIAGNOSIS — N529 Male erectile dysfunction, unspecified: Secondary | ICD-10-CM

## 2020-01-17 DIAGNOSIS — E782 Mixed hyperlipidemia: Secondary | ICD-10-CM | POA: Diagnosis not present

## 2020-01-17 DIAGNOSIS — Z0001 Encounter for general adult medical examination with abnormal findings: Secondary | ICD-10-CM

## 2020-01-17 DIAGNOSIS — Z114 Encounter for screening for human immunodeficiency virus [HIV]: Secondary | ICD-10-CM

## 2020-01-17 DIAGNOSIS — K648 Other hemorrhoids: Secondary | ICD-10-CM

## 2020-01-17 MED ORDER — SILDENAFIL CITRATE 25 MG PO TABS: 25.0000 mg | ORAL_TABLET | Freq: Every day | ORAL | 0 refills | Status: DC | PRN

## 2020-01-17 MED ORDER — AMLODIPINE BESYLATE 5 MG PO TABS: 5.0000 mg | ORAL_TABLET | Freq: Every day | ORAL | 3 refills | Status: DC

## 2020-01-17 MED ORDER — ROSUVASTATIN CALCIUM 10 MG PO TABS: 10.0000 mg | ORAL_TABLET | Freq: Every day | ORAL | 3 refills | Status: DC

## 2020-01-17 MED ORDER — BENAZEPRIL-HYDROCHLOROTHIAZIDE 10-12.5 MG PO TABS: 1.0000 | ORAL_TABLET | Freq: Every day | ORAL | 3 refills | Status: DC

## 2020-01-17 MED ORDER — HYDROCORTISONE ACETATE 25 MG RE SUPP: 25.0000 mg | Freq: Two times a day (BID) | RECTAL | 0 refills | Status: AC

## 2020-01-17 NOTE — Addendum Note (Signed)
Addended by: Brynda Peon F on: 01/17/2020 12:35 PM   Modules accepted: Orders

## 2020-01-17 NOTE — Patient Instructions (Signed)

## 2020-01-17 NOTE — Progress Notes (Signed)
Subjective:    Patient ID: Logan Smith, male    DOB: 11/20/1970, 49 y.o.   MRN: [id number]  Chief Complaint  Patient presents with  . Anal Itching    Wants cream swtched you perscribed it does not help with itching   . Medical Management of Chronic Issues   Pt presents to the office today to establish care and CPE. He reports he had hemorrhoids removed 4-5 years ago, he reports after a BM now he has itching. He tried suppositories with no relief.  Hypertension This is a chronic problem. The current episode started more than 1 year ago. The problem has been resolved since onset. The problem is controlled. Pertinent negatives include no malaise/fatigue, peripheral edema or shortness of breath. Risk factors for coronary artery disease include dyslipidemia, obesity, male gender and sedentary lifestyle. The current treatment provides moderate improvement.  Hyperlipidemia This is a chronic problem. The current episode started more than 1 year ago. The problem is controlled. Recent lipid tests were reviewed and are normal. Exacerbating diseases include obesity. Pertinent negatives include no shortness of breath. Current antihyperlipidemic treatment includes statins. The current treatment provides moderate improvement of lipids. Risk factors for coronary artery disease include dyslipidemia, male sex, hypertension and a sedentary lifestyle.   ED Takes Viagra as needed.    Review of Systems  Constitutional: Negative for malaise/fatigue.  Respiratory: Negative for shortness of breath.    Family History  Problem Relation Age of Onset  . Diabetes Mother   . Hypertension Mother   . Hypertension Father    Social History   Socioeconomic History  . Marital status: Single    Spouse name: Not on file  . Number of children: Not on file  . Years of education: Not on file  . Highest education level: Not on file  Occupational History  . Not on file  Tobacco Use  . Smoking status: Never Smoker    . Smokeless tobacco: Never Used  Substance and Sexual Activity  . Alcohol use: No    Comment: rarely  . Drug use: No  . Sexual activity: Not on file  Other Topics Concern  . Not on file  Social History Narrative  . Not on file   Social Determinants of Health   Financial Resource Strain:   . Difficulty of Paying Living Expenses:   Food Insecurity:   . Worried About Charity fundraiser in the Last Year:   . Arboriculturist in the Last Year:   Transportation Needs:   . Film/video editor (Medical):   Marland Kitchen Lack of Transportation (Non-Medical):   Physical Activity:   . Days of Exercise per Week:   . Minutes of Exercise per Session:   Stress:   . Feeling of Stress :   Social Connections:   . Frequency of Communication with Friends and Family:   . Frequency of Social Gatherings with Friends and Family:   . Attends Religious Services:   . Active Member of Clubs or Organizations:   . Attends Archivist Meetings:   Marland Kitchen Marital Status:        Objective:   Physical Exam Vitals reviewed.  Constitutional:      General: He is not in acute distress.    Appearance: He is well-developed.  HENT:     Head: Normocephalic.     Right Ear: Tympanic membrane normal.     Left Ear: Tympanic membrane normal.  Eyes:     General:  Right eye: No discharge.        Left eye: No discharge.     Pupils: Pupils are equal, round, and reactive to light.  Neck:     Thyroid: No thyromegaly.  Cardiovascular:     Rate and Rhythm: Normal rate and regular rhythm.     Heart sounds: Normal heart sounds. No murmur heard.   Pulmonary:     Effort: Pulmonary effort is normal. No respiratory distress.     Breath sounds: Normal breath sounds. No wheezing.  Abdominal:     General: Bowel sounds are normal. There is no distension.     Palpations: Abdomen is soft.     Tenderness: There is no abdominal tenderness.  Genitourinary:    Comments: No external hemorrhoids noted. Musculoskeletal:         General: No tenderness. Normal range of motion.     Cervical back: Normal range of motion and neck supple.  Skin:    General: Skin is warm and dry.     Findings: No erythema or rash.  Neurological:     Mental Status: He is alert and oriented to person, place, and time.     Cranial Nerves: No cranial nerve deficit.     Deep Tendon Reflexes: Reflexes are normal and symmetric.  Psychiatric:        Behavior: Behavior normal.        Thought Content: Thought content normal.        Judgment: Judgment normal.       BP 129/84   Pulse 94   Temp 97.7 F (36.5 C) (Temporal)   Ht 6' (1.829 m)   Wt 286 lb 9.6 oz (130 kg)   SpO2 95%   BMI 38.87 kg/m      Assessment & Plan:  Logan Smith comes in today with chief complaint of Anal Itching (Wants cream swtched you perscribed it does not help with itching ) and Medical Management of Chronic Issues   Diagnosis and orders addressed:  1. Acute left-sided low back pain with left-sided sciatica - CMP14+EGFR - CBC with Differential/Platelet  2. Erectile dysfunction, unspecified erectile dysfunction type - sildenafil (VIAGRA) 25 MG tablet; Take 1 tablet (25 mg total) by mouth daily as needed for erectile dysfunction.  Dispense: 10 tablet; Refill: 0 - CMP14+EGFR - CBC with Differential/Platelet  3. Essential hypertension - benazepril-hydrochlorthiazide (LOTENSIN HCT) 10-12.5 MG tablet; Take 1 tablet by mouth daily.  Dispense: 90 tablet; Refill: 3 - amLODipine (NORVASC) 5 MG tablet; Take 1 tablet (5 mg total) by mouth daily.  Dispense: 90 tablet; Refill: 3 - CMP14+EGFR - CBC with Differential/Platelet  4. Mixed hyperlipidemia - rosuvastatin (CRESTOR) 10 MG tablet; Take 1 tablet (10 mg total) by mouth daily.  Dispense: 90 tablet; Refill: 3 - CMP14+EGFR - CBC with Differential/Platelet - Lipid panel  5. Obesity (BMI 30-39.9) - CMP14+EGFR - CBC with Differential/Platelet  6. Hemorrhoids, internal - CMP14+EGFR - CBC with  Differential/Platelet - Ambulatory referral to General Surgery - hydrocortisone (ANUSOL-HC) 25 MG suppository; Place 1 suppository (25 mg total) rectally 2 (two) times daily.  Dispense: 12 suppository; Refill: 0  7. Annual physical exam - CMP14+EGFR - CBC with Differential/Platelet - Lipid panel - TSH - PSA, total and free - Hepatitis C antibody - HIV Antibody (routine testing w rflx)  8. Anal itch - CMP14+EGFR - CBC with Differential/Platelet - Chlamydia/Gonococcus/Trichomonas, NAA  9. Need for hepatitis C screening test - CMP14+EGFR - CBC with Differential/Platelet - Hepatitis C antibody  10. Encounter  for screening for HIV - CMP14+EGFR - CBC with Differential/Platelet - HIV Antibody (routine testing w rflx)   Labs pending Health Maintenance reviewed Diet and exercise encouraged  Follow up plan: 6 months    Evelina Dun, FNP

## 2020-01-18 ENCOUNTER — Encounter: Payer: Self-pay | Admitting: Family

## 2020-01-18 LAB — CMP14+EGFR
ALT: 29 IU/L (ref 0–44)
AST: 24 IU/L (ref 0–40)
Albumin/Globulin Ratio: 1.9 (ref 1.2–2.2)
Albumin: 4.5 g/dL (ref 4.0–5.0)
Alkaline Phosphatase: 82 IU/L (ref 48–121)
BUN/Creatinine Ratio: 14 (ref 9–20)
BUN: 12 mg/dL (ref 6–24)
Bilirubin Total: 0.4 mg/dL (ref 0.0–1.2)
CO2: 26 mmol/L (ref 20–29)
Calcium: 10 mg/dL (ref 8.7–10.2)
Chloride: 101 mmol/L (ref 96–106)
Creatinine, Ser: 0.88 mg/dL (ref 0.76–1.27)
GFR calc Af Amer: 118 mL/min/{1.73_m2} (ref >59)
GFR calc non Af Amer: 102 mL/min/{1.73_m2} (ref >59)
Globulin, Total: 2.4 g/dL (ref 1.5–4.5)
Glucose: 94 mg/dL (ref 65–99)
Potassium: 4 mmol/L (ref 3.5–5.2)
Sodium: 139 mmol/L (ref 134–144)
Total Protein: 6.9 g/dL (ref 6.0–8.5)

## 2020-01-18 LAB — GC/CHLAMYDIA PROBE AMP (~~LOC~~) NOT AT ARMC
Chlamydia: NEGATIVE
Comment: NEGATIVE
Comment: NORMAL
Neisseria Gonorrhea: NEGATIVE

## 2020-01-18 LAB — CBC WITH DIFFERENTIAL/PLATELET
Basophils Absolute: 0 10*3/uL (ref 0.0–0.2)
Basos: 1 %
EOS (ABSOLUTE): 0.2 10*3/uL (ref 0.0–0.4)
Eos: 2 %
Hematocrit: 42.9 % (ref 37.5–51.0)
Hemoglobin: 14.2 g/dL (ref 13.0–17.7)
Immature Grans (Abs): 0 10*3/uL (ref 0.0–0.1)
Immature Granulocytes: 0 %
Lymphocytes Absolute: 2.3 10*3/uL (ref 0.7–3.1)
Lymphs: 30 %
MCH: 30.6 pg (ref 26.6–33.0)
MCHC: 33.1 g/dL (ref 31.5–35.7)
MCV: 93 fL (ref 79–97)
Monocytes Absolute: 0.5 10*3/uL (ref 0.1–0.9)
Monocytes: 7 %
Neutrophils Absolute: 4.8 10*3/uL (ref 1.4–7.0)
Neutrophils: 60 %
Platelets: 306 10*3/uL (ref 150–450)
RBC: 4.64 x10E6/uL (ref 4.14–5.80)
RDW: 12.3 % (ref 11.6–15.4)
WBC: 7.8 10*3/uL (ref 3.4–10.8)

## 2020-01-18 LAB — LIPID PANEL
Chol/HDL Ratio: 3.3 ratio (ref 0.0–5.0)
Cholesterol, Total: 124 mg/dL (ref 100–199)
HDL: 38 mg/dL — ABNORMAL LOW (ref >39)
LDL Chol Calc (NIH): 66 mg/dL (ref 0–99)
Triglycerides: 111 mg/dL (ref 0–149)
VLDL Cholesterol Cal: 20 mg/dL (ref 5–40)

## 2020-01-18 LAB — PSA, TOTAL AND FREE
PSA, Free Pct: 19.3 %
PSA, Free: 0.83 ng/mL
Prostate Specific Ag, Serum: 4.3 ng/mL — ABNORMAL HIGH (ref 0.0–4.0)

## 2020-01-18 LAB — TSH: TSH: 1.41 u[IU]/mL (ref 0.450–4.500)

## 2020-01-18 LAB — HIV ANTIBODY (ROUTINE TESTING W REFLEX): HIV Screen 4th Generation wRfx: NONREACTIVE

## 2020-01-18 LAB — HEPATITIS C ANTIBODY: Hep C Virus Ab: <0.1 {s_co_ratio} (ref 0.0–0.9)

## 2020-02-05 ENCOUNTER — Ambulatory Visit: Payer: BC Managed Care – PPO | Admitting: General Surgery

## 2020-02-19 ENCOUNTER — Ambulatory Visit: Payer: BC Managed Care – PPO | Admitting: General Surgery

## 2020-03-04 ENCOUNTER — Ambulatory Visit: Payer: BC Managed Care – PPO | Admitting: General Surgery

## 2020-03-06 DIAGNOSIS — M549 Dorsalgia, unspecified: Secondary | ICD-10-CM | POA: Diagnosis not present

## 2020-03-06 DIAGNOSIS — Z6838 Body mass index (BMI) 38.0-38.9, adult: Secondary | ICD-10-CM | POA: Diagnosis not present

## 2020-03-11 ENCOUNTER — Ambulatory Visit: Payer: BC Managed Care – PPO | Admitting: General Surgery

## 2020-04-07 DIAGNOSIS — Z029 Encounter for administrative examinations, unspecified: Secondary | ICD-10-CM

## 2020-05-01 ENCOUNTER — Other Ambulatory Visit: Payer: Self-pay | Admitting: Family

## 2020-05-01 DIAGNOSIS — I10 Essential (primary) hypertension: Secondary | ICD-10-CM

## 2020-05-20 ENCOUNTER — Ambulatory Visit (INDEPENDENT_AMBULATORY_CARE_PROVIDER_SITE_OTHER): Payer: BC Managed Care – PPO | Admitting: General Surgery

## 2020-05-20 ENCOUNTER — Other Ambulatory Visit: Payer: Self-pay | Admitting: Family Medicine

## 2020-05-20 ENCOUNTER — Other Ambulatory Visit: Payer: Self-pay

## 2020-05-20 ENCOUNTER — Encounter: Payer: Self-pay | Admitting: General Surgery

## 2020-05-20 VITALS — BP 149/97 | HR 101 | Temp 98.1°F | Resp 16 | Ht 72.0 in | Wt 286.0 lb

## 2020-05-20 DIAGNOSIS — L290 Pruritus ani: Secondary | ICD-10-CM | POA: Diagnosis not present

## 2020-05-20 MED ORDER — HYDROCORTISONE 1 % EX CREA
TOPICAL_CREAM | CUTANEOUS | 1 refills | Status: AC
Start: 2020-05-20 — End: 2021-05-20

## 2020-05-20 NOTE — Patient Instructions (Signed)
Perianal Dermatitis, Adult Perianal dermatitis is inflammation of the skin around the anus. This condition causes patches of red, irritated skin that may be itchy or painful. It can be passed from one person to another (contagious), depending on what caused it. What are the causes? This condition may be caused by:  Contact with an irritant, such as stool, urine, mucus, soap, or sweat.  Bacteria, especially certain types of strep (streptococcal) bacteria.  Hemorrhoids.  Fungus.  Skin conditions, such as psoriasis.  Medical conditions, such as diabetes and Crohn disease.  Certain foods.  An opening between the rectum and the skin around the anus (fistula). What increases the risk? This condition is more likely to develop in:  People who are unable to control when they go to the bathroom (have incontinence).  People who have trouble walking or moving around (mobility problems).  People with thin or fragile skin.  People taking oral antibiotic medicines or steroids. What are the signs or symptoms? Itchy, red patches of skin are the main symptom of this condition. The skin in this area may also be swollen and have:  Cracks or tears (fissures).  Small, raised bumps (papules).  Small, pus-filled blisters (pustules). Other symptoms include:  Pain when passing stools.  Blood in the stool.  Itching around the anus.  Tenderness around the anus.  Holding back stools to avoid pain (constipation). How is this diagnosed? This condition is diagnosed with a medical history and physical exam. The diagnosis is confirmed by testing a sample of the skin for bacteria or fungus. How is this treated? Treatment for this condition depends on the cause. Mild cases may go away without treatment when contact with the irritant is stopped. Treatment for more severe cases of perianal dermatitis may include medicines, such as:  A waterproof barrier cream.  Topical or oral corticosteroids to  ease skin inflammation.  Antihistamines to relieve itching.  Antibiotics to treat a bacterial infection.  Antifungal cream to treat a fungal infection. Severe fungal infections may also be treated with topical steroids and medicine to control itching. Follow these instructions at home:   Take over-the-counter and prescription medicines only as told by your health care provider.  Apply prescribed or suggested creams only as told by your health care provider.  If you were prescribed an antibiotic, take it as told by your health care provider. Do not stop taking the antibiotic even if your condition improves.  Do not scratch the affected area.  Wash your hands carefully with soap and warm water after each time that you use the bathroom. If soap and water are not available, use hand sanitizer. Make sure other people in your household wash their hands frequently as well.  Keep all follow-up visits as told by your health care provider. This is important. How is this prevented?  Avoid contact with any substances that cause perianal inflammation.  Keep the perianal area clean and dry. Contact a health care provider if:  Your symptoms do not improve with treatment.  Your symptoms get worse.  Your symptoms go away and then return.  You are not able to control your bowel movements or you are leaking stool (have fecal incontinence).  You have a fever. Get help right away if:  You frequently pass blood in your stools.  You lose weight and you do not know why.  You have fluid, blood, or pus coming from the rash site. Summary  Perianal dermatitis is inflammation of the skin around the anus. This  condition causes patches of red, irritated skin that may be itchy or painful.  Itchy, red patches of skin are the main symptom of this condition.  Treatment for this condition depends on the cause. Mild cases may go away without treatment when contact with the irritant is stopped. Treatment  for more severe cases of perianal dermatitis may include medicines.  Wash your hands carefully with soap and warm water after each time that you use the bathroom. If soap and water are not available, use hand sanitizer.  Keep the perianal area clean and dry to prevent this condition. This information is not intended to replace advice given to you by your health care provider. Make sure you discuss any questions you have with your health care provider. Document Revised: 06/10/2017 Document Reviewed: 09/09/2016 Elsevier Patient Education  [place].

## 2020-05-21 NOTE — Progress Notes (Signed)
Logan Smith; [id number]; [birth year]   HPI Patient is a 49 year old black male who was referred to my care by Evelina Dun for evaluation treatment of possible hemorrhoidal disease.  Patient states he has had intermittent blood on the toilet paper when he wipes himself.  He also complains of itching around the anus.  He is status post condyloma excision in 2015.  He states he does not feel hemorrhoids present.  No masses are noted.  He denies any melena or gross blood per rectum. Past Medical History:  Diagnosis Date  . Back pain   . Erectile dysfunction   . Hyperlipidemia   . Hypertension     Past Surgical History:  Procedure Laterality Date  . HEMORROIDECTOMY    . VASECTOMY      Family History  Problem Relation Age of Onset  . Diabetes Mother   . Hypertension Mother   . Hypertension Father     Current Outpatient Medications on File Prior to Visit  Medication Sig Dispense Refill  . amLODipine (NORVASC) 5 MG tablet TAKE 1 TABLET BY MOUTH EVERY DAY 90 tablet 0  . benazepril-hydrochlorthiazide (LOTENSIN HCT) 10-12.5 MG tablet Take 1 tablet by mouth daily. 90 tablet 3  . fish oil-omega-3 fatty acids 1000 MG capsule Take 2 g by mouth daily.    . hydrocortisone (ANUSOL-HC) 25 MG suppository Place 1 suppository (25 mg total) rectally 2 (two) times daily. 12 suppository 0  . Multiple Vitamins-Minerals (MENS 50+ MULTI VITAMIN/MIN PO) Take by mouth.    . pramoxine-hydrocortisone (PROCTOCREAM-HC) 1-1 % rectal cream Place 1 application rectally 2 (two) times daily. 30 g 0  . RETIN-A MICRO PUMP 0.08 % GEL Apply to face NIGHTLY    . rosuvastatin (CRESTOR) 10 MG tablet Take 1 tablet (10 mg total) by mouth daily. 90 tablet 3  . sildenafil (VIAGRA) 25 MG tablet Take 1 tablet (25 mg total) by mouth daily as needed for erectile dysfunction. 10 tablet 0  . SULFACLEANSE 8/4 8-4 % SUSP 1 application apply on the skin twice a day  1 application apply on the skin twice a day wash face twice daily      No current facility-administered medications on file prior to visit.    No Known Allergies  Social History   Substance and Sexual Activity  Alcohol Use No   Comment: rarely    Social History   Tobacco Use  Smoking Status Never Smoker  Smokeless Tobacco Never Used    Review of Systems  Constitutional: Negative.   HENT: Positive for sinus pain.   Eyes: Negative.   Respiratory: Negative.   Cardiovascular: Negative.   Gastrointestinal: Negative.   Genitourinary: Negative.   Musculoskeletal: Positive for back pain.  Skin: Negative.   Neurological: Negative.   Endo/Heme/Allergies: Negative.   Psychiatric/Behavioral: Negative.     Objective   Vitals:   05/20/20 1351  BP: (!) 149/97  Pulse: (!) 101  Resp: 16  Temp: 98.1 F (36.7 C)  SpO2: 94%    Physical Exam Vitals reviewed.  Constitutional:      Appearance: Normal appearance. He is obese. He is not ill-appearing.  HENT:     Head: Normocephalic and atraumatic.  Cardiovascular:     Rate and Rhythm: Regular rhythm.     Heart sounds: Normal heart sounds. No murmur heard.  No friction rub. No gallop.   Pulmonary:     Effort: Pulmonary effort is normal. No respiratory distress.     Breath sounds: Normal breath sounds. No stridor.  No wheezing, rhonchi or rales.  Genitourinary:    Comments: Rectal examination reveals no external hemorrhoids or condyloma present.  Digital rectal examination was somewhat limited secondary to body habitus, but I could not appreciate any significant rectal masses or internal hemorrhoids.  No blood was present.  There is mild skin excoriation in the perianal region. Skin:    General: Skin is warm and dry.  Neurological:     Mental Status: He is alert and oriented to person, place, and time.     Assessment  Pruritus ani.  No significant hemorrhoidal disease that require surgical intervention Plan   Hydrocortisone cream to the perianal region twice a day.  He was also told to use  medicated wipes and pat himself dry with toilet paper.  He will see how this does over the next 1 to 2 months.  He was instructed to return should he have ongoing blood on the toilet paper when he wipes himself.  He understands and agrees.  Follow-up here expectantly.

## 2020-05-30 ENCOUNTER — Telehealth: Payer: Self-pay

## 2020-05-30 DIAGNOSIS — Z1211 Encounter for screening for malignant neoplasm of colon: Secondary | ICD-10-CM

## 2020-05-30 NOTE — Telephone Encounter (Signed)
Referral placed.

## 2020-05-30 NOTE — Telephone Encounter (Signed)
REFERRAL REQUEST Telephone Note  Have you been seen at our office for this problem? NO Preventive care last CPE 01/17/2020 (Advise that they may need an appointment with their PCP before a referral can be done)  Reason for Referral: colonoscopy Referral discussed with patient: no but did discuss hemorrhoids Best contact number of patient for referral team:   [phone] Has patient been seen by a specialist for this issue before: NO Patient provider preference for referral: [facility] Patient location preference for referral: [facility]   Patient notified that referrals can take up to a week or longer to process. If they haven't heard anything within a week they should call back and speak with the referral department.

## 2020-07-18 DIAGNOSIS — R42 Dizziness and giddiness: Secondary | ICD-10-CM | POA: Diagnosis not present

## 2020-07-18 DIAGNOSIS — Z6835 Body mass index (BMI) 35.0-35.9, adult: Secondary | ICD-10-CM | POA: Diagnosis not present

## 2020-07-22 ENCOUNTER — Ambulatory Visit: Payer: BC Managed Care – PPO | Admitting: Family

## 2020-07-22 ENCOUNTER — Other Ambulatory Visit: Payer: Self-pay

## 2020-07-24 ENCOUNTER — Encounter: Payer: Self-pay | Admitting: Family

## 2020-08-05 ENCOUNTER — Other Ambulatory Visit: Payer: Self-pay | Admitting: Family

## 2020-08-05 DIAGNOSIS — I10 Essential (primary) hypertension: Secondary | ICD-10-CM

## 2020-09-03 ENCOUNTER — Encounter: Payer: BC Managed Care – PPO | Admitting: Gastroenterology

## 2020-09-09 ENCOUNTER — Ambulatory Visit: Payer: BC Managed Care – PPO | Admitting: Family

## 2020-09-22 ENCOUNTER — Telehealth: Payer: Self-pay

## 2020-09-22 DIAGNOSIS — Z1211 Encounter for screening for malignant neoplasm of colon: Secondary | ICD-10-CM

## 2020-09-23 NOTE — Telephone Encounter (Signed)
Referral placed.

## 2020-09-24 ENCOUNTER — Encounter (INDEPENDENT_AMBULATORY_CARE_PROVIDER_SITE_OTHER): Payer: Self-pay | Admitting: *Deleted

## 2020-10-02 ENCOUNTER — Encounter: Payer: Self-pay | Admitting: *Deleted

## 2020-10-07 ENCOUNTER — Encounter: Payer: BC Managed Care – PPO | Admitting: Gastroenterology

## 2020-10-07 ENCOUNTER — Ambulatory Visit: Payer: BC Managed Care – PPO | Admitting: General Surgery

## 2020-10-10 ENCOUNTER — Ambulatory Visit: Payer: BC Managed Care – PPO | Admitting: Family

## 2020-10-13 DIAGNOSIS — Z6835 Body mass index (BMI) 35.0-35.9, adult: Secondary | ICD-10-CM | POA: Diagnosis not present

## 2020-10-13 DIAGNOSIS — I10 Essential (primary) hypertension: Secondary | ICD-10-CM | POA: Diagnosis not present

## 2020-10-16 ENCOUNTER — Ambulatory Visit: Payer: BC Managed Care – PPO | Admitting: Family

## 2020-10-17 ENCOUNTER — Encounter: Payer: Self-pay | Admitting: Nurse Practitioner

## 2020-10-17 ENCOUNTER — Ambulatory Visit (INDEPENDENT_AMBULATORY_CARE_PROVIDER_SITE_OTHER): Payer: BC Managed Care – PPO | Admitting: Nurse Practitioner

## 2020-10-17 DIAGNOSIS — J301 Allergic rhinitis due to pollen: Secondary | ICD-10-CM

## 2020-10-17 MED ORDER — FLUTICASONE PROPIONATE 50 MCG/ACT NA SUSP: 2.0000 | Freq: Every day | NASAL | 6 refills | Status: DC

## 2020-10-17 MED ORDER — CETIRIZINE HCL 10 MG PO TABS: 10.0000 mg | ORAL_TABLET | Freq: Every day | ORAL | 11 refills | Status: DC

## 2020-10-17 NOTE — Progress Notes (Signed)
   Virtual Visit  Note Due to COVID-19 pandemic this visit was conducted virtually. This visit type was conducted due to national recommendations for restrictions regarding the COVID-19 Pandemic (e.g. social distancing, sheltering in place) in an effort to limit this patient's exposure and mitigate transmission in our community. All issues noted in this document were discussed and addressed.  A physical exam was not performed with this format.  I connected with Logan Smith on 10/17/20 at *3:00 by telephone and verified that I am speaking with the correct person using two identifiers. Logan Smith is currently located at home and no one is currently with him during visit. The provider, Mary-Margaret Hassell Done, FNP is located in their office at time of visit.  I discussed the limitations, risks, security and privacy concerns of performing an evaluation and management service by telephone and the availability of in person appointments. I also discussed with the patient that there Smith be a patient responsible charge related to this service. The patient expressed understanding and agreed to proceed.   History and Present Illness:   Chief Complaint: Allergic Rhinitis    HPI Patient calls in today for telephone visit stating that he has runny nose and sneezing that stated several days ago. He has developed a cough form post nasal drip and his eyes are itching.    Review of Systems  Constitutional: Negative for chills, fever and malaise/fatigue.  HENT: Positive for congestion. Negative for sinus pain and sore throat.   Eyes: Negative for blurred vision, double vision, photophobia, pain, discharge and redness.  Respiratory: Positive for cough. Negative for sputum production and shortness of breath.   Musculoskeletal: Positive for myalgias.  Neurological: Negative for dizziness and headaches.     Observations/Objective: Alert and oriented- answers all questions appropriately No  distress sneezing   Assessment and Plan: Ondra Deboard in today with chief complaint of Allergic Rhinitis    1. Seasonal allergic rhinitis due to pollen Force fluids OTC allergy eyedrops as needed daily Avoid allergens - fluticasone (FLONASE) 50 MCG/ACT nasal spray; Place 2 sprays into both nostrils daily.  Dispense: 16 g; Refill: 6 - cetirizine (ZYRTEC) 10 MG tablet; Take 1 tablet (10 mg total) by mouth daily.  Dispense: 30 tablet; Refill: 11     Follow Up Instructions: prn    I discussed the assessment and treatment plan with the patient. The patient was provided an opportunity to ask questions and all were answered. The patient agreed with the plan and demonstrated an understanding of the instructions.   The patient was advised to call back or seek an in-person evaluation if the symptoms worsen or if the condition fails to improve as anticipated.  The above assessment and management plan was discussed with the patient. The patient verbalized understanding of and has agreed to the management plan. Patient is aware to call the clinic if symptoms persist or worsen. Patient is aware when to return to the clinic for a follow-up visit. Patient educated on when it is appropriate to go to the emergency department.   Time call ended:  3:12  I provided 12 minutes of  non face-to-face time during this encounter.    Mary-Margaret Hassell Done, FNP

## 2020-10-23 ENCOUNTER — Encounter: Payer: Self-pay | Admitting: General Surgery

## 2020-10-23 ENCOUNTER — Other Ambulatory Visit: Payer: Self-pay | Admitting: General Surgery

## 2020-10-23 ENCOUNTER — Ambulatory Visit (INDEPENDENT_AMBULATORY_CARE_PROVIDER_SITE_OTHER): Payer: BC Managed Care – PPO | Admitting: General Surgery

## 2020-10-23 ENCOUNTER — Other Ambulatory Visit: Payer: Self-pay

## 2020-10-23 VITALS — BP 138/80 | HR 80 | Temp 97.1°F | Resp 14 | Ht 72.0 in | Wt 285.0 lb

## 2020-10-23 DIAGNOSIS — Z1211 Encounter for screening for malignant neoplasm of colon: Secondary | ICD-10-CM | POA: Diagnosis not present

## 2020-10-23 MED ORDER — SUTAB 1479-225-188 MG PO TABS: 1.0000 | ORAL_TABLET | Freq: Once | ORAL | 0 refills | Status: DC

## 2020-10-23 NOTE — Patient Instructions (Signed)
Colonoscopy, Adult A colonoscopy is a procedure to look at the entire large intestine. This procedure is done using a long, thin, flexible tube that has a camera on the end. You may have a colonoscopy:  As a part of normal colorectal screening.  If you have certain symptoms, such as: ? A low number of red blood cells in your blood (anemia). ? Diarrhea that does not go away. ? Pain in your abdomen. ? Blood in your stool. A colonoscopy can help screen for and diagnose medical problems, including:  Tumors.  Extra tissue that grows where mucus forms (polyps).  Inflammation.  Areas of bleeding. Tell your health care provider about:  Any allergies you have.  All medicines you are taking, including vitamins, herbs, eye drops, creams, and over-the-counter medicines.  Any problems you or family members have had with anesthetic medicines.  Any blood disorders you have.  Any surgeries you have had.  Any medical conditions you have.  Any problems you have had with having bowel movements.  Whether you are pregnant or may be pregnant. What are the risks? Generally, this is a safe procedure. However, problems may occur, including:  Bleeding.  Damage to your intestine.  Allergic reactions to medicines given during the procedure.  Infection. This is rare. What happens before the procedure? Eating and drinking restrictions Follow instructions from your health care provider about eating or drinking restrictions, which may include:  A few days before the procedure: ? Follow a low-fiber diet. ? Avoid nuts, seeds, dried fruit, raw fruits, and vegetables.  1-3 days before the procedure: ? Eat only gelatin dessert or ice pops. ? Drink only clear liquids, such as water, clear juice, clear broth or bouillon, black coffee or tea, or clear soft drinks or sports drinks. ? Avoid liquids that contain red or purple dye. The day of the procedure: Bowel prep If you were prescribed a bowel  prep to take by mouth (orally) to clean out your colon:  Take it as told by your health care provider. Starting the day before your procedure, you will need to drink a large amount of liquid medicine. The liquid will cause you to have many bowel movements of loose stool until your stool becomes almost clear or light green.  If your skin or the opening between the buttocks (anus) gets irritated from diarrhea, you may relieve the irritation using: ? Wipes with medicine in them, such as adult wet wipes with aloe and vitamin E. ? A product to soothe skin, such as petroleum jelly.  If you vomit while drinking the bowel prep: ? Take a break for up to 60 minutes. ? Begin the bowel prep again. ? Call your health care provider if you keep vomiting or you cannot take the bowel prep without vomiting.  To clean out your colon, you may also be given: ? Laxative medicines. These help you have a bowel movement. ? Instructions for enema use. An enema is liquid medicine injected into your rectum. Medicines Ask your health care provider about:  Changing or stopping your regular medicines or supplements. This is especially important if you are taking iron supplements, diabetes medicines, or blood thinners.  Taking medicines such as aspirin and ibuprofen. These medicines can thin your blood. Do not take these medicines unless your health care provider tells you to take them.  Taking over-the-counter medicines, vitamins, herbs, and supplements. General instructions  Ask your health care provider what steps will be taken to help prevent infection. These may  include washing skin with a germ-killing soap.  Plan to have someone take you home from the hospital or clinic. What happens during the procedure?  An IV will be inserted into one of your veins.  You may be given one or more of the following: ? A medicine to help you relax (sedative). ? A medicine to numb the area (local anesthetic). ? A medicine to  make you fall asleep (general anesthetic). This is rarely needed.  You will lie on your side with your knees bent.  The tube will: ? Have oil or gel put on it (be lubricated). ? Be inserted into your anus. ? Be gently eased through all parts of your large intestine.  Air will be sent into your colon to keep it open. This may cause some pressure or cramping.  Images will be taken with the camera and will appear on a screen.  A small tissue sample may be removed to be looked at under a microscope (biopsy). The tissue may be sent to a lab for testing if any signs of problems are found.  If small polyps are found, they may be removed and checked for cancer cells.  When the procedure is finished, the tube will be removed. The procedure may vary among health care providers and hospitals.   What happens after the procedure?  Your blood pressure, heart rate, breathing rate, and blood oxygen level will be monitored until you leave the hospital or clinic.  You may have a small amount of blood in your stool.  You may pass gas and have mild cramping or bloating in your abdomen. This is caused by the air that was used to open your colon during the exam.  Do not drive for 24 hours after the procedure.  It is up to you to get the results of your procedure. Ask your health care provider, or the department that is doing the procedure, when your results will be ready. Summary  A colonoscopy is a procedure to look at the entire large intestine.  Follow instructions from your health care provider about eating and drinking before the procedure.  If you were prescribed an oral bowel prep to clean out your colon, take it as told by your health care provider.  During the colonoscopy, a flexible tube with a camera on its end is inserted into the anus and then passed into the other parts of the large intestine. This information is not intended to replace advice given to you by your health care provider.  Make sure you discuss any questions you have with your health care provider. Document Revised: 01/19/2019 Document Reviewed: 01/19/2019 Elsevier Patient Education  [employer].

## 2020-10-23 NOTE — H&P (Signed)
Logan Smith; [id number]; 10-06-1970   HPI Patient is a 50 year old black male who was referred to my care by Logan Smith for a screening colonoscopy.  Patient has never had a colonoscopy.  There is no family history of colon cancer.  He denies any significant abdominal pain, weight loss, diarrhea, constipation, or frank blood per rectum.  He has had a history of pruritus ani. Past Medical History:  Diagnosis Date  . Back pain   . Erectile dysfunction   . Hyperlipidemia   . Hypertension     Past Surgical History:  Procedure Laterality Date  . HEMORROIDECTOMY    . VASECTOMY      Family History  Problem Relation Age of Onset  . Diabetes Mother   . Hypertension Mother   . Hypertension Father     Current Outpatient Medications on File Prior to Visit  Medication Sig Dispense Refill  . amLODipine (NORVASC) 5 MG tablet TAKE 1 TABLET BY MOUTH EVERY DAY 90 tablet 0  . benazepril-hydrochlorthiazide (LOTENSIN HCT) 10-12.5 MG tablet Take 1 tablet by mouth daily. 90 tablet 3  . cetirizine (ZYRTEC) 10 MG tablet Take 1 tablet (10 mg total) by mouth daily. 30 tablet 11  . fish oil-omega-3 fatty acids 1000 MG capsule Take 2 g by mouth daily.    . fluticasone (FLONASE) 50 MCG/ACT nasal spray Place 2 sprays into both nostrils daily. 16 g 6  . hydrocortisone (ANUSOL-HC) 25 MG suppository Place 1 suppository (25 mg total) rectally 2 (two) times daily. 12 suppository 0  . hydrocortisone cream 1 % Apply to affected area 2 times daily 30 g 1  . Multiple Vitamins-Minerals (MENS 50+ MULTI VITAMIN/MIN PO) Take by mouth.    . pramoxine-hydrocortisone (PROCTOCREAM-HC) 1-1 % rectal cream Place 1 application rectally 2 (two) times daily. 30 g 0  . RETIN-A MICRO PUMP 0.08 % GEL Apply to face NIGHTLY    . rosuvastatin (CRESTOR) 10 MG tablet Take 1 tablet (10 mg total) by mouth daily. 90 tablet 3  . sildenafil (VIAGRA) 25 MG tablet Take 1 tablet (25 mg total) by mouth daily as needed for erectile dysfunction.  10 tablet 0  . SULFACLEANSE 8/4 8-4 % SUSP 1 application apply on the skin twice a day  1 application apply on the skin twice a day wash face twice daily     No current facility-administered medications on file prior to visit.    No Known Allergies  Social History   Substance and Sexual Activity  Alcohol Use No   Comment: rarely    Social History   Tobacco Use  Smoking Status Never Smoker  Smokeless Tobacco Never Used    Review of Systems  Constitutional: Negative.   HENT: Negative.   Eyes: Negative.   Respiratory: Negative.   Cardiovascular: Negative.   Gastrointestinal: Negative.   Genitourinary: Negative.   Musculoskeletal: Negative.   Skin: Negative.   Neurological: Negative.   Endo/Heme/Allergies: Negative.   Psychiatric/Behavioral: Negative.     Objective   Vitals:   10/23/20 0853  BP: 138/80  Pulse: 80  Resp: 14  Temp: (!) 97.1 F (36.2 C)  SpO2: 95%    Physical Exam Vitals reviewed.  Constitutional:      Appearance: Normal appearance. He is not ill-appearing.  HENT:     Head: Normocephalic and atraumatic.  Cardiovascular:     Rate and Rhythm: Normal rate and regular rhythm.     Heart sounds: Normal heart sounds. No murmur heard. No friction rub. No gallop.  Pulmonary:     Effort: Pulmonary effort is normal. No respiratory distress.     Breath sounds: Normal breath sounds. No stridor. No wheezing, rhonchi or rales.  Abdominal:     General: Bowel sounds are normal. There is no distension.     Palpations: Abdomen is soft. There is no mass.     Tenderness: There is no abdominal tenderness. There is no guarding or rebound.     Hernia: No hernia is present.  Skin:    General: Skin is warm and dry.  Neurological:     Mental Status: He is alert and oriented to person, place, and time.   Previous office notes reviewed  Assessment  Need for screening colonoscopy Plan   Patient is scheduled for a screening colonoscopy on 11/21/2020.  The risks  and benefits of the procedure including bleeding and perforation were fully explained to the patient, who gave informed consent.  Sutabs bowel prep has been ordered.

## 2020-10-23 NOTE — Progress Notes (Signed)
Logan Smith; [id number]; [birth year]   HPI Patient is a 50 year old black male who was referred to my care by Logan Smith for a screening colonoscopy.  Patient has never had a colonoscopy.  There is no family history of colon cancer.  He denies any significant abdominal pain, weight loss, diarrhea, constipation, or frank blood per rectum.  He has had a history of pruritus ani. Past Medical History:  Diagnosis Date  . Back pain   . Erectile dysfunction   . Hyperlipidemia   . Hypertension     Past Surgical History:  Procedure Laterality Date  . HEMORROIDECTOMY    . VASECTOMY      Family History  Problem Relation Age of Onset  . Diabetes Logan Smith   . Hypertension Logan Smith   . Hypertension Logan Smith     Current Outpatient Medications on File Prior to Visit  Medication Sig Dispense Refill  . amLODipine (NORVASC) 5 MG tablet TAKE 1 TABLET BY MOUTH EVERY DAY 90 tablet 0  . benazepril-hydrochlorthiazide (LOTENSIN HCT) 10-12.5 MG tablet Take 1 tablet by mouth daily. 90 tablet 3  . cetirizine (ZYRTEC) 10 MG tablet Take 1 tablet (10 mg total) by mouth daily. 30 tablet 11  . fish oil-omega-3 fatty acids 1000 MG capsule Take 2 g by mouth daily.    . fluticasone (FLONASE) 50 MCG/ACT nasal spray Place 2 sprays into both nostrils daily. 16 g 6  . hydrocortisone (ANUSOL-HC) 25 MG suppository Place 1 suppository (25 mg total) rectally 2 (two) times daily. 12 suppository 0  . hydrocortisone cream 1 % Apply to affected area 2 times daily 30 g 1  . Multiple Vitamins-Minerals (MENS 50+ MULTI VITAMIN/MIN PO) Take by mouth.    . pramoxine-hydrocortisone (PROCTOCREAM-HC) 1-1 % rectal cream Place 1 application rectally 2 (two) times daily. 30 g 0  . RETIN-A MICRO PUMP 0.08 % GEL Apply to face NIGHTLY    . rosuvastatin (CRESTOR) 10 MG tablet Take 1 tablet (10 mg total) by mouth daily. 90 tablet 3  . sildenafil (VIAGRA) 25 MG tablet Take 1 tablet (25 mg total) by mouth daily as needed for erectile dysfunction.  10 tablet 0  . SULFACLEANSE 8/4 8-4 % SUSP 1 application apply on the skin twice a day  1 application apply on the skin twice a day wash face twice daily     No current facility-administered medications on file prior to visit.    No Known Allergies  Social History   Substance and Sexual Activity  Alcohol Use No   Comment: rarely    Social History   Tobacco Use  Smoking Status Never Smoker  Smokeless Tobacco Never Used    Review of Systems  Constitutional: Negative.   HENT: Negative.   Eyes: Negative.   Respiratory: Negative.   Cardiovascular: Negative.   Gastrointestinal: Negative.   Genitourinary: Negative.   Musculoskeletal: Negative.   Skin: Negative.   Neurological: Negative.   Endo/Heme/Allergies: Negative.   Psychiatric/Behavioral: Negative.     Objective   Vitals:   10/23/20 0853  BP: 138/80  Pulse: 80  Resp: 14  Temp: (!) 97.1 F (36.2 C)  SpO2: 95%    Physical Exam Vitals reviewed.  Constitutional:      Appearance: Normal appearance. He is not ill-appearing.  HENT:     Head: Normocephalic and atraumatic.  Cardiovascular:     Rate and Rhythm: Normal rate and regular rhythm.     Heart sounds: Normal heart sounds. No murmur heard. No friction rub. No gallop.  Pulmonary:     Effort: Pulmonary effort is normal. No respiratory distress.     Breath sounds: Normal breath sounds. No stridor. No wheezing, rhonchi or rales.  Abdominal:     General: Bowel sounds are normal. There is no distension.     Palpations: Abdomen is soft. There is no mass.     Tenderness: There is no abdominal tenderness. There is no guarding or rebound.     Hernia: No hernia is present.  Skin:    General: Skin is warm and dry.  Neurological:     Mental Status: He is alert and oriented to person, place, and time.   Previous office notes reviewed  Assessment  Need for screening colonoscopy Plan   Patient is scheduled for a screening colonoscopy on 11/21/2020.  The risks  and benefits of the procedure including bleeding and perforation were fully explained to the patient, who gave informed consent.  Sutabs bowel prep has been ordered.

## 2020-11-07 ENCOUNTER — Other Ambulatory Visit: Payer: Self-pay | Admitting: General Surgery

## 2020-11-11 ENCOUNTER — Ambulatory Visit: Payer: BC Managed Care – PPO | Admitting: Family

## 2020-11-13 ENCOUNTER — Other Ambulatory Visit: Payer: Self-pay | Admitting: General Surgery

## 2020-11-14 ENCOUNTER — Other Ambulatory Visit: Payer: Self-pay | Admitting: Family

## 2020-11-14 ENCOUNTER — Encounter: Payer: Self-pay | Admitting: Family

## 2020-11-19 ENCOUNTER — Other Ambulatory Visit (HOSPITAL_COMMUNITY)
Admission: RE | Admit: 2020-11-19 | Discharge: 2020-11-19 | Disposition: A | Discharge status: Home / Self Care | Payer: BC Managed Care – PPO | Source: Ambulatory Visit | Attending: General Surgery | Admitting: General Surgery

## 2020-11-19 ENCOUNTER — Other Ambulatory Visit: Payer: Self-pay

## 2020-11-19 DIAGNOSIS — Z20822 Contact with and (suspected) exposure to covid-19: Secondary | ICD-10-CM | POA: Insufficient documentation

## 2020-11-19 DIAGNOSIS — Z01812 Encounter for preprocedural laboratory examination: Secondary | ICD-10-CM | POA: Insufficient documentation

## 2020-11-19 DIAGNOSIS — Z1211 Encounter for screening for malignant neoplasm of colon: Secondary | ICD-10-CM | POA: Diagnosis not present

## 2020-11-19 DIAGNOSIS — Z79899 Other long term (current) drug therapy: Secondary | ICD-10-CM | POA: Diagnosis not present

## 2020-11-20 LAB — SARS CORONAVIRUS 2 (TAT 6-24 HRS): SARS Coronavirus 2: NEGATIVE

## 2020-11-21 ENCOUNTER — Encounter (HOSPITAL_COMMUNITY)
Admission: RE | Disposition: A | Discharge status: Home / Self Care | Payer: Self-pay | Source: Ambulatory Visit | Attending: General Surgery

## 2020-11-21 ENCOUNTER — Other Ambulatory Visit: Payer: Self-pay

## 2020-11-21 ENCOUNTER — Encounter (HOSPITAL_COMMUNITY): Payer: Self-pay | Admitting: General Surgery

## 2020-11-21 ENCOUNTER — Ambulatory Visit (HOSPITAL_COMMUNITY)
Admission: RE | Admit: 2020-11-21 | Discharge: 2020-11-21 | Disposition: A | Discharge status: Home / Self Care | Payer: BC Managed Care – PPO | Source: Ambulatory Visit | Attending: General Surgery | Admitting: General Surgery

## 2020-11-21 DIAGNOSIS — Z20822 Contact with and (suspected) exposure to covid-19: Secondary | ICD-10-CM | POA: Insufficient documentation

## 2020-11-21 DIAGNOSIS — Z1211 Encounter for screening for malignant neoplasm of colon: Secondary | ICD-10-CM | POA: Diagnosis not present

## 2020-11-21 DIAGNOSIS — Z79899 Other long term (current) drug therapy: Secondary | ICD-10-CM | POA: Insufficient documentation

## 2020-11-21 HISTORY — PX: COLONOSCOPY: SHX5424

## 2020-11-21 SURGERY — COLONOSCOPY
Anesthesia: Moderate Sedation

## 2020-11-21 MED ORDER — MIDAZOLAM HCL 5 MG/5ML IJ SOLN
INTRAMUSCULAR | Status: DC | PRN
  Administered 2020-11-21: 3 mg via INTRAVENOUS
  Administered 2020-11-21: 1 mg via INTRAVENOUS

## 2020-11-21 MED ORDER — MEPERIDINE HCL 50 MG/ML IJ SOLN
INTRAMUSCULAR | Status: DC | PRN
  Administered 2020-11-21: 50 mg via INTRAVENOUS

## 2020-11-21 MED ORDER — SODIUM CHLORIDE 0.9 % IV SOLN: INTRAVENOUS | Status: DC

## 2020-11-21 MED ORDER — STERILE WATER FOR IRRIGATION IR SOLN
Status: DC | PRN
  Administered 2020-11-21: 1.5 mL

## 2020-11-21 MED ORDER — MIDAZOLAM HCL 5 MG/5ML IJ SOLN
INTRAMUSCULAR | Status: AC
  Filled 2020-11-21: qty 10

## 2020-11-21 MED ORDER — MEPERIDINE HCL 50 MG/ML IJ SOLN
INTRAMUSCULAR | Status: AC
  Filled 2020-11-21: qty 1

## 2020-11-21 NOTE — Interval H&P Note (Signed)
History and Physical Interval Note:  11/21/2020 6:46 AM  Logan Smith  has presented today for surgery, with the diagnosis of Screening.  The various methods of treatment have been discussed with the patient and family. After consideration of risks, benefits and other options for treatment, the patient has consented to  Procedure(s): COLONOSCOPY (N/A) as a surgical intervention.  The patient's history has been reviewed, patient examined, no change in status, stable for surgery.  I have reviewed the patient's chart and labs.  Questions were answered to the patient's satisfaction.     Aviva Signs

## 2020-11-21 NOTE — Discharge Instructions (Signed)
Colonoscopy, Adult, Care After This sheet gives you information about how to care for yourself after your procedure. Your doctor may also give you more specific instructions. If you have problems or questions, call your doctor. What can I expect after the procedure? After the procedure, it is common to have:  A small amount of blood in your poop (stool) for 24 hours.  Some gas.  Mild cramping or bloating in your belly (abdomen). Follow these instructions at home: Eating and drinking  Drink enough fluid to keep your pee (urine) pale yellow.  Follow instructions from your doctor about what you cannot eat or drink.  Return to your normal diet as told by your doctor. Avoid heavy or fried foods that are hard to digest.   Activity  Rest as told by your doctor.  Do not sit for a long time without moving. Get up to take short walks every 1-2 hours. This is important. Ask for help if you feel weak or unsteady.  Return to your normal activities as told by your doctor. Ask your doctor what activities are safe for you. To help cramping and bloating:  Try walking around.  Put heat on your belly as told by your doctor. Use the heat source that your doctor recommends, such as a moist heat pack or a heating pad. ? Put a towel between your skin and the heat source. ? Leave the heat on for 20-30 minutes. ? Remove the heat if your skin turns bright red. This is very important if you are unable to feel pain, heat, or cold. You may have a greater risk of getting burned.   General instructions  If you were given a medicine to help you relax (sedative) during your procedure, it can affect you for many hours. Do not drive or use machinery until your doctor says that it is safe.  For the first 24 hours after the procedure: ? Do not sign important documents. ? Do not drink alcohol. ? Do your daily activities more slowly than normal. ? Eat foods that are soft and easy to digest.  Take  over-the-counter or prescription medicines only as told by your doctor.  Keep all follow-up visits as told by your doctor. This is important. Contact a doctor if:  You have blood in your poop 2-3 days after the procedure. Get help right away if:  You have more than a small amount of blood in your poop.  You see large clumps of tissue (blood clots) in your poop.  Your belly is swollen.  You feel like you may vomit (nauseous).  You vomit.  You have a fever.  You have belly pain that gets worse, and medicine does not help your pain. Summary  After the procedure, it is common to have a small amount of blood in your poop. You may also have mild cramping and bloating in your belly.  If you were given a medicine to help you relax (sedative) during your procedure, it can affect you for many hours. Do not drive or use machinery until your doctor says that it is safe.  Get help right away if you have a lot of blood in your poop, feel like you may vomit, have a fever, or have more belly pain. This information is not intended to replace advice given to you by your health care provider. Make sure you discuss any questions you have with your health care provider. Document Revised: 05/04/2019 Document Reviewed: 01/22/2019 Elsevier Patient Education  2021  [facility] POST-ANESTHESIA  IMMEDIATELY FOLLOWING SURGERY:  Do not drive or operate machinery for the first twenty four hours after surgery.  Do not make any important decisions for twenty four hours after surgery or while taking narcotic pain medications or sedatives.  If you develop intractable nausea and vomiting or a severe headache please notify your doctor immediately.  FOLLOW-UP:  Please make an appointment with your surgeon as instructed. You do not need to follow up with anesthesia unless specifically instructed to do so.  WOUND CARE INSTRUCTIONS (if applicable):  Keep a dry clean dressing on the  anesthesia/puncture wound site if there is drainage.  Once the wound has quit draining you may leave it open to air.  Generally you should leave the bandage intact for twenty four hours unless there is drainage.  If the epidural site drains for more than 36-48 hours please call the anesthesia department.  QUESTIONS?:  Please feel free to call your physician or the hospital operator if you have any questions, and they will be happy to assist you.

## 2020-11-21 NOTE — Op Note (Signed)
[facility] Patient Name: Logan Smith Procedure Date: 11/21/2020 6:43 AM MRN: [id number] Date of Birth: 09/06/1970 Attending MD: Aviva Signs , MD CSN: [id number] Age: 50 Admit Type: Outpatient Procedure:                Colonoscopy Indications:              Screening for colorectal malignant neoplasm Providers:                Aviva Signs, MD, Charlsie Quest. Theda Sers RN, RN, Raphael Gibney, Technician Referring MD:              Medicines:                Midazolam 4 mg IV, Meperidine 50 mg IV Complications:            No immediate complications. Estimated Blood Loss:     Estimated blood loss: none. Procedure:                Pre-Anesthesia Assessment:                           - Prior to the procedure, a History and Physical                            was performed, and patient medications and                            allergies were reviewed. The patient is competent.                            The risks and benefits of the procedure and the                            sedation options and risks were discussed with the                            patient. All questions were answered and informed                            consent was obtained. Patient identification and                            proposed procedure were verified by the physician,                            the nurse and the technician in the endoscopy                            suite. Mental Status Examination: alert and                            oriented. Airway Examination: normal oropharyngeal  airway and neck mobility. Respiratory Examination:                            clear to auscultation. CV Examination: RRR, no                            murmurs, no S3 or S4. Prophylactic Antibiotics: The                            patient does not require prophylactic antibiotics.                            Prior Anticoagulants: The patient has taken no                             previous anticoagulant or antiplatelet agents. ASA                            Grade Assessment: II - A patient with mild systemic                            disease. After reviewing the risks and benefits,                            the patient was deemed in satisfactory condition to                            undergo the procedure. The anesthesia plan was to                            use moderate sedation / analgesia (conscious                            sedation). Immediately prior to administration of                            medications, the patient was re-assessed for                            adequacy to receive sedatives. The heart rate,                            respiratory rate, oxygen saturations, blood                            pressure, adequacy of pulmonary ventilation, and                            response to care were monitored throughout the                            procedure. The physical status of the patient was  re-assessed after the procedure.                           After obtaining informed consent, the colonoscope                            was passed under direct vision. Throughout the                            procedure, the patient's blood pressure, pulse, and                            oxygen saturations were monitored continuously. The                            CF-[id number] ([id number]) scope was introduced through                            the anus and advanced to the the cecum, identified                            by the appendiceal orifice, ileocecal valve and                            palpation. No anatomical landmarks were                            photographed. The entire colon was well visualized.                            The colonoscopy was performed without difficulty.                            The patient tolerated the procedure well. The                            quality of the bowel preparation was  excellent. The                            total duration of the procedure was 9 minutes. Scope In: 6:51:01 AM Scope Out: 7:00:29 AM Scope Withdrawal Time: 0 hours 4 minutes 12 seconds  Total Procedure Duration: 0 hours 9 minutes 28 seconds  Findings:      The perianal and digital rectal examinations were normal.      The entire examined colon appeared normal on direct and retroflexion       views. Impression:               - The entire examined colon is normal on direct and                            retroflexion views.                           - No specimens collected. Moderate Sedation:      Moderate (  conscious) sedation was administered by the endoscopy nurse       and supervised by the endoscopist. The patient's oxygen saturation,       heart rate, blood pressure and response to care were monitored. Recommendation:           - Written discharge instructions were provided to                            the patient.                           - The signs and symptoms of potential delayed                            complications were discussed with the patient.                           - Patient has a contact number available for                            emergencies.                           - Return to normal activities tomorrow.                           - Resume previous diet.                           - Continue present medications.                           - Repeat colonoscopy in 10 years for screening                            purposes. Procedure Code(s):        --- Professional ---                           [phone], Colonoscopy, flexible; diagnostic, including                            collection of specimen(s) by brushing or washing,                            when performed (separate procedure) Diagnosis Code(s):        --- Professional ---                           Z12.11, Encounter for screening for malignant                            neoplasm of colon CPT  copyright 2019 American Medical Association. All rights reserved. The codes documented in this report are preliminary and upon coder review may  be revised to meet current compliance requirements. Aviva Signs, MD Aviva Signs, MD 11/21/2020 7:04:09 AM This report has been signed electronically. Number of Addenda:  0 

## 2020-11-27 ENCOUNTER — Encounter (HOSPITAL_COMMUNITY): Payer: Self-pay | Admitting: General Surgery

## 2020-12-03 DIAGNOSIS — M25511 Pain in right shoulder: Secondary | ICD-10-CM | POA: Diagnosis not present

## 2020-12-03 DIAGNOSIS — Z6838 Body mass index (BMI) 38.0-38.9, adult: Secondary | ICD-10-CM | POA: Diagnosis not present

## 2021-02-13 ENCOUNTER — Other Ambulatory Visit: Payer: Self-pay | Admitting: Family

## 2021-02-13 DIAGNOSIS — I10 Essential (primary) hypertension: Secondary | ICD-10-CM

## 2021-03-05 ENCOUNTER — Telehealth: Payer: Self-pay | Admitting: Family

## 2021-03-05 DIAGNOSIS — I10 Essential (primary) hypertension: Secondary | ICD-10-CM

## 2021-03-05 NOTE — Telephone Encounter (Signed)
Last OV for chronic ckup was 01/2020. Made appt with christy for [facility] 03/09/20

## 2021-03-05 NOTE — Telephone Encounter (Signed)
  Prescription Request  03/05/2021  Is this a "Controlled Substance" medicine? Have you seen your PCP in the last 2 weeks? If YES, route message to pool  -  If NO, patient needs to be seen.  What is the name of the medication or equipment? Amolodipine  Have you contacted your pharmacy to request a refill? yes  Which pharmacy would you like this sent to? [facility]  Patient notified that their request is being sent to the clinical staff for review and that they should receive a response within 2 business days.    Lenna Gilford' pt.  He changed from [place] to [place] [facility]  Please make this change in his chart too for all his meds.

## 2021-03-09 ENCOUNTER — Ambulatory Visit: Payer: BC Managed Care – PPO | Admitting: Family

## 2021-03-17 ENCOUNTER — Ambulatory Visit: Payer: BC Managed Care – PPO | Admitting: Family

## 2021-03-20 ENCOUNTER — Encounter: Payer: Self-pay | Admitting: Family

## 2021-03-20 ENCOUNTER — Ambulatory Visit (INDEPENDENT_AMBULATORY_CARE_PROVIDER_SITE_OTHER): Payer: BC Managed Care – PPO | Admitting: Family

## 2021-03-20 ENCOUNTER — Other Ambulatory Visit: Payer: Self-pay

## 2021-03-20 VITALS — BP 136/88 | HR 89 | Temp 98.3°F | Ht 72.0 in | Wt 286.4 lb

## 2021-03-20 DIAGNOSIS — E782 Mixed hyperlipidemia: Secondary | ICD-10-CM | POA: Diagnosis not present

## 2021-03-20 DIAGNOSIS — L700 Acne vulgaris: Secondary | ICD-10-CM

## 2021-03-20 DIAGNOSIS — Z0001 Encounter for general adult medical examination with abnormal findings: Secondary | ICD-10-CM | POA: Diagnosis not present

## 2021-03-20 DIAGNOSIS — I10 Essential (primary) hypertension: Secondary | ICD-10-CM | POA: Diagnosis not present

## 2021-03-20 DIAGNOSIS — Z0000 Encounter for general adult medical examination without abnormal findings: Secondary | ICD-10-CM | POA: Diagnosis not present

## 2021-03-20 MED ORDER — ROSUVASTATIN CALCIUM 10 MG PO TABS: 10.0000 mg | ORAL_TABLET | Freq: Every day | ORAL | 3 refills | Status: DC

## 2021-03-20 MED ORDER — AMLODIPINE BESYLATE 5 MG PO TABS
5.0000 mg | ORAL_TABLET | Freq: Every day | ORAL | 1 refills | Status: DC
Start: 2021-03-20 — End: 2021-08-25

## 2021-03-20 MED ORDER — SULFACLEANSE 8/4 8-4 % EX SUSP: 1.0000 "application " | Freq: Two times a day (BID) | CUTANEOUS | 4 refills | Status: DC

## 2021-03-20 MED ORDER — BENAZEPRIL-HYDROCHLOROTHIAZIDE 10-12.5 MG PO TABS: 1.0000 | ORAL_TABLET | Freq: Every day | ORAL | 3 refills | Status: DC

## 2021-03-20 NOTE — Progress Notes (Signed)
Subjective:    Patient ID: Logan Smith, male    DOB: [birth year], 50 y.o.   MRN: [id number]  Chief Complaint  Patient presents with   Medical Management of Chronic Issues   Pt presents to the office today  CPE. He had a colonoscopy 11/21/20. He is considered morbid obese with a BMI of 38.8 and comorbidity of HTN.  Hypertension This is a chronic problem. The current episode started more than 1 year ago. The problem has been resolved since onset. The problem is controlled. Associated symptoms include malaise/fatigue. Pertinent negatives include no peripheral edema or shortness of breath. Risk factors for coronary artery disease include dyslipidemia, obesity and male gender. The current treatment provides moderate improvement.  Hyperlipidemia This is a chronic problem. The current episode started more than 1 year ago. Exacerbating diseases include obesity. Pertinent negatives include no shortness of breath. Current antihyperlipidemic treatment includes statins. The current treatment provides moderate improvement of lipids. Risk factors for coronary artery disease include dyslipidemia, male sex, hypertension and a sedentary lifestyle.     Review of Systems  Constitutional:  Positive for malaise/fatigue.  Respiratory:  Negative for shortness of breath.   All other systems reviewed and are negative.     Family History  Problem Relation Age of Onset   Diabetes Mother    Hypertension Mother    Hypertension Father    Social History   Socioeconomic History   Marital status: Single    Spouse name: Not on file   Number of children: Not on file   Years of education: Not on file   Highest education level: Not on file  Occupational History   Not on file  Tobacco Use   Smoking status: Never   Smokeless tobacco: Never  Substance and Sexual Activity   Alcohol use: No    Comment: rarely   Drug use: No   Sexual activity: Not on file  Other Topics Concern   Not on file  Social History  Narrative   Not on file   Social Determinants of Health   Financial Resource Strain: Not on file  Food Insecurity: Not on file  Transportation Needs: Not on file  Physical Activity: Not on file  Stress: Not on file  Social Connections: Not on file    Objective:   Physical Exam Vitals reviewed.  Constitutional:      General: He is not in acute distress.    Appearance: He is well-developed. He is obese.  HENT:     Head: Normocephalic.     Right Ear: Tympanic membrane normal.     Left Ear: Tympanic membrane normal.  Eyes:     General:        Right eye: No discharge.        Left eye: No discharge.     Pupils: Pupils are equal, round, and reactive to light.  Neck:     Thyroid: No thyromegaly.  Cardiovascular:     Rate and Rhythm: Normal rate and regular rhythm.     Heart sounds: Normal heart sounds. No murmur heard. Pulmonary:     Effort: Pulmonary effort is normal. No respiratory distress.     Breath sounds: Normal breath sounds. No wheezing.  Abdominal:     General: Bowel sounds are normal. There is no distension.     Palpations: Abdomen is soft.     Tenderness: There is no abdominal tenderness.  Musculoskeletal:        General: No tenderness. Normal range of motion.  Cervical back: Normal range of motion and neck supple.  Skin:    General: Skin is warm and dry.     Findings: No erythema or rash.  Neurological:     Mental Status: He is alert and oriented to person, place, and time.     Cranial Nerves: No cranial nerve deficit.     Deep Tendon Reflexes: Reflexes are normal and symmetric.  Psychiatric:        Behavior: Behavior normal.        Thought Content: Thought content normal.        Judgment: Judgment normal.      BP 136/88   Pulse 89   Temp 98.3 F (36.8 C) (Temporal)   Ht 6' (1.829 m)   Wt 286 lb 6.4 oz (129.9 kg)   BMI 38.84 kg/m      Assessment & Plan:  Trusten Hume comes in today with chief complaint of Medical Management of Chronic  Issues   Diagnosis and orders addressed:  1. Essential hypertension  2. Mixed hyperlipidemia - rosuvastatin (CRESTOR) 10 MG tablet; Take 1 tablet (10 mg total) by mouth daily.  Dispense: 90 tablet; Refill: 3 - CMP14+EGFR - CBC with Differential/Platelet  3. Annual physical exam - CMP14+EGFR - CBC with Differential/Platelet - Lipid panel - PSA, total and free - TSH - SULFACLEANSE 8/4 8-4 % SUSP; Apply 1 application topically in the morning and at bedtime.  Dispense: 473 mL; Refill: 4  4. Primary hypertension - amLODipine (NORVASC) 5 MG tablet; Take 1 tablet (5 mg total) by mouth daily.  Dispense: 90 tablet; Refill: 1 - benazepril-hydrochlorthiazide (LOTENSIN HCT) 10-12.5 MG tablet; Take 1 tablet by mouth daily.  Dispense: 90 tablet; Refill: 3 - CMP14+EGFR - CBC with Differential/Platelet  5. Morbid obesity ([place]) - CMP14+EGFR - CBC with Differential/Platelet  6. Acne vulgaris - CMP14+EGFR - CBC with Differential/Platelet - SULFACLEANSE 8/4 8-4 % SUSP; Apply 1 application topically in the morning and at bedtime.  Dispense: 473 mL; Refill: 4   Labs pending Health Maintenance reviewed Diet and exercise encouraged  Follow up plan: 1 year    Evelina Dun, FNP

## 2021-03-20 NOTE — Patient Instructions (Signed)
Health Maintenance, Male Adopting a healthy lifestyle and getting preventive care are important in promoting health and wellness. Ask your health care provider about: The right schedule for you to have regular tests and exams. Things you can do on your own to prevent diseases and keep yourself healthy. What should I know about diet, weight, and exercise? Eat a healthy diet  Eat a diet that includes plenty of vegetables, fruits, low-fat dairy products, and lean protein. Do not eat a lot of foods that are high in solid fats, added sugars, or sodium. Maintain a healthy weight Body mass index (BMI) is a measurement that can be used to identify possible weight problems. It estimates body fat based on height and weight. Your health care provider can help determine your BMI and help you achieve or maintain a healthy weight. Get regular exercise Get regular exercise. This is one of the most important things you can do for your health. Most adults should: Exercise for at least 150 minutes each week. The exercise should increase your heart rate and make you sweat (moderate-intensity exercise). Do strengthening exercises at least twice a week. This is in addition to the moderate-intensity exercise. Spend less time sitting. Even light physical activity can be beneficial. Watch cholesterol and blood lipids Have your blood tested for lipids and cholesterol at 49 years of age, then have this test every 5 years. You may need to have your cholesterol levels checked more often if: Your lipid or cholesterol levels are high. You are older than 49 years of age. You are at high risk for heart disease. What should I know about cancer screening? Many types of cancers can be detected early and may often be prevented. Depending on your health history and family history, you may need to have cancer screening at various ages. This may include screening for: Colorectal cancer. Prostate cancer. Skin cancer. Lung  cancer. What should I know about heart disease, diabetes, and high blood pressure? Blood pressure and heart disease High blood pressure causes heart disease and increases the risk of stroke. This is more likely to develop in people who have high blood pressure readings, are of African descent, or are overweight. Talk with your health care provider about your target blood pressure readings. Have your blood pressure checked: Every 3-5 years if you are 18-39 years of age. Every year if you are 40 years old or older. If you are between the ages of 65 and 75 and are a current or former smoker, ask your health care provider if you should have a one-time screening for abdominal aortic aneurysm (AAA). Diabetes Have regular diabetes screenings. This checks your fasting blood sugar level. Have the screening done: Once every three years after age 45 if you are at a normal weight and have a low risk for diabetes. More often and at a younger age if you are overweight or have a high risk for diabetes. What should I know about preventing infection? Hepatitis B If you have a higher risk for hepatitis B, you should be screened for this virus. Talk with your health care provider to find out if you are at risk for hepatitis B infection. Hepatitis C Blood testing is recommended for: Everyone born from 1945 through 1965. Anyone with known risk factors for hepatitis C. Sexually transmitted infections (STIs) You should be screened each year for STIs, including gonorrhea and chlamydia, if: You are sexually active and are younger than 49 years of age. You are older than 49 years   of age and your health care provider tells you that you are at risk for this type of infection. Your sexual activity has changed since you were last screened, and you are at increased risk for chlamydia or gonorrhea. Ask your health care provider if you are at risk. Ask your health care provider about whether you are at high risk for HIV.  Your health care provider may recommend a prescription medicine to help prevent HIV infection. If you choose to take medicine to prevent HIV, you should first get tested for HIV. You should then be tested every 3 months for as long as you are taking the medicine. Follow these instructions at home: Lifestyle Do not use any products that contain nicotine or tobacco, such as cigarettes, e-cigarettes, and chewing tobacco. If you need help quitting, ask your health care provider. Do not use street drugs. Do not share needles. Ask your health care provider for help if you need support or information about quitting drugs. Alcohol use Do not drink alcohol if your health care provider tells you not to drink. If you drink alcohol: Limit how much you have to 0-2 drinks a day. Be aware of how much alcohol is in your drink. In the [place] one drink equals one 12 oz bottle of beer (355 mL), one 5 oz glass of wine (148 mL), or one 1 oz glass of hard liquor (44 mL). General instructions Schedule regular health, dental, and eye exams. Stay current with your vaccines. Tell your health care provider if: You often feel depressed. You have ever been abused or do not feel safe at home. Summary Adopting a healthy lifestyle and getting preventive care are important in promoting health and wellness. Follow your health care provider's instructions about healthy diet, exercising, and getting tested or screened for diseases. Follow your health care provider's instructions on monitoring your cholesterol and blood pressure. This information is not intended to replace advice given to you by your health care provider. Make sure you discuss any questions you have with your health care provider. Document Revised: 09/05/2020 Document Reviewed: 06/21/2018 Elsevier Patient Education  2022 Elsevier Inc.  

## 2021-03-21 LAB — CMP14+EGFR
ALT: 21 IU/L (ref 0–44)
AST: 15 IU/L (ref 0–40)
Albumin/Globulin Ratio: 1.5 (ref 1.2–2.2)
Albumin: 4.1 g/dL (ref 4.0–5.0)
Alkaline Phosphatase: 85 IU/L (ref 44–121)
BUN/Creatinine Ratio: 15 (ref 9–20)
BUN: 13 mg/dL (ref 6–24)
Bilirubin Total: 0.2 mg/dL (ref 0.0–1.2)
CO2: 25 mmol/L (ref 20–29)
Calcium: 9.4 mg/dL (ref 8.7–10.2)
Chloride: 102 mmol/L (ref 96–106)
Creatinine, Ser: 0.87 mg/dL (ref 0.76–1.27)
Globulin, Total: 2.7 g/dL (ref 1.5–4.5)
Glucose: 96 mg/dL (ref 65–99)
Potassium: 4 mmol/L (ref 3.5–5.2)
Sodium: 140 mmol/L (ref 134–144)
Total Protein: 6.8 g/dL (ref 6.0–8.5)
eGFR: 106 mL/min/{1.73_m2} (ref >59)

## 2021-03-21 LAB — CBC WITH DIFFERENTIAL/PLATELET
Basophils Absolute: 0 10*3/uL (ref 0.0–0.2)
Basos: 0 %
EOS (ABSOLUTE): 0.2 10*3/uL (ref 0.0–0.4)
Eos: 2 %
Hematocrit: 43.5 % (ref 37.5–51.0)
Hemoglobin: 14.2 g/dL (ref 13.0–17.7)
Immature Grans (Abs): 0 10*3/uL (ref 0.0–0.1)
Immature Granulocytes: 0 %
Lymphocytes Absolute: 2.3 10*3/uL (ref 0.7–3.1)
Lymphs: 29 %
MCH: 29.7 pg (ref 26.6–33.0)
MCHC: 32.6 g/dL (ref 31.5–35.7)
MCV: 91 fL (ref 79–97)
Monocytes Absolute: 0.5 10*3/uL (ref 0.1–0.9)
Monocytes: 7 %
Neutrophils Absolute: 4.9 10*3/uL (ref 1.4–7.0)
Neutrophils: 62 %
Platelets: 313 10*3/uL (ref 150–450)
RBC: 4.78 x10E6/uL (ref 4.14–5.80)
RDW: 12.3 % (ref 11.6–15.4)
WBC: 7.9 10*3/uL (ref 3.4–10.8)

## 2021-03-21 LAB — TSH: TSH: 1.16 u[IU]/mL (ref 0.450–4.500)

## 2021-03-21 LAB — LIPID PANEL
Chol/HDL Ratio: 3.1 ratio (ref 0.0–5.0)
Cholesterol, Total: 113 mg/dL (ref 100–199)
HDL: 37 mg/dL — ABNORMAL LOW (ref >39)
LDL Chol Calc (NIH): 60 mg/dL (ref 0–99)
Triglycerides: 82 mg/dL (ref 0–149)
VLDL Cholesterol Cal: 16 mg/dL (ref 5–40)

## 2021-03-21 LAB — PSA, TOTAL AND FREE
PSA, Free Pct: 16.8 %
PSA, Free: 0.69 ng/mL
Prostate Specific Ag, Serum: 4.1 ng/mL — ABNORMAL HIGH (ref 0.0–4.0)

## 2021-03-24 ENCOUNTER — Other Ambulatory Visit: Payer: Self-pay | Admitting: Family

## 2021-03-24 DIAGNOSIS — Z0000 Encounter for general adult medical examination without abnormal findings: Secondary | ICD-10-CM

## 2021-03-24 DIAGNOSIS — L700 Acne vulgaris: Secondary | ICD-10-CM

## 2021-03-26 ENCOUNTER — Other Ambulatory Visit: Payer: Self-pay | Admitting: Family

## 2021-03-26 MED ORDER — CLINDAMYCIN PHOS-BENZOYL PEROX 1-5 % EX GEL: CUTANEOUS | 2 refills | Status: AC

## 2021-06-19 DIAGNOSIS — J0100 Acute maxillary sinusitis, unspecified: Secondary | ICD-10-CM | POA: Diagnosis not present

## 2021-06-19 DIAGNOSIS — R051 Acute cough: Secondary | ICD-10-CM | POA: Diagnosis not present

## 2021-08-25 ENCOUNTER — Other Ambulatory Visit: Payer: Self-pay | Admitting: Family

## 2021-08-25 DIAGNOSIS — I10 Essential (primary) hypertension: Secondary | ICD-10-CM

## 2021-10-14 ENCOUNTER — Encounter: Payer: Self-pay | Admitting: Family

## 2021-10-14 ENCOUNTER — Ambulatory Visit (INDEPENDENT_AMBULATORY_CARE_PROVIDER_SITE_OTHER): Payer: BC Managed Care – PPO | Admitting: Family

## 2021-10-14 VITALS — BP 134/84 | HR 89 | Temp 97.8°F | Ht 72.0 in | Wt 284.0 lb

## 2021-10-14 DIAGNOSIS — D170 Benign lipomatous neoplasm of skin and subcutaneous tissue of head, face and neck: Secondary | ICD-10-CM | POA: Diagnosis not present

## 2021-10-14 DIAGNOSIS — R519 Headache, unspecified: Secondary | ICD-10-CM

## 2021-10-14 DIAGNOSIS — R221 Localized swelling, mass and lump, neck: Secondary | ICD-10-CM

## 2021-10-14 MED ORDER — NAPROXEN 500 MG PO TABS: 500.0000 mg | ORAL_TABLET | Freq: Two times a day (BID) | ORAL | 1 refills | Status: DC

## 2021-10-14 MED ORDER — NAPROXEN 500 MG PO TABS: 500.0000 mg | ORAL_TABLET | Freq: Two times a day (BID) | ORAL | 0 refills | Status: DC

## 2021-10-14 NOTE — Progress Notes (Signed)
? ?Subjective:  ? ? Patient ID: Logan Smith, male    DOB: [birth year], 51 y.o.   MRN: [id number] ? ?Chief Complaint  ?Patient presents with  ? Headache  ? Cyst  ?  On neck  ?  ? ?States three days while having sex he started having a dull intermittent achy headache. States it has been constant since.  ? ?He is also complaining of a mass on his right neck that has been there for years. Denies any pain. Reports it is the same size.  ?Headache  ?This is a new problem. The current episode started in the past 7 days. The problem occurs intermittently. The problem has been waxing and waning. The pain is located in the Frontal region. The pain does not radiate. The pain quality is not similar to prior headaches. The quality of the pain is described as aching and throbbing. The pain is at a severity of 5/10. Pertinent negatives include no abnormal behavior, blurred vision, dizziness, drainage, ear pain, eye pain, eye redness, hearing loss, loss of balance, muscle aches, phonophobia, photophobia or sinus pressure.  ? ? ? ?Review of Systems  ?HENT:  Negative for ear pain, hearing loss and sinus pressure.   ?Eyes:  Negative for blurred vision, photophobia, pain and redness.  ?Neurological:  Positive for headaches. Negative for dizziness and loss of balance.  ?All other systems reviewed and are negative. ? ?   ?Objective:  ? Physical Exam ?Vitals reviewed.  ?Constitutional:   ?   General: He is not in acute distress. ?   Appearance: He is well-developed.  ?HENT:  ?   Head: Normocephalic.  ?   Right Ear: External ear normal.  ?   Left Ear: External ear normal.  ?Eyes:  ?   General:     ?   Right eye: No discharge.     ?   Left eye: No discharge.  ?   Pupils: Pupils are equal, round, and reactive to light.  ?Neck:  ?   Thyroid: No thyromegaly.  ?   Comments: Small movable mass on right posterior neck that is 2.5X3 cm  ?Cardiovascular:  ?   Rate and Rhythm: Normal rate and regular rhythm.  ?   Heart sounds: Normal heart sounds.  No murmur heard. ?Pulmonary:  ?   Effort: Pulmonary effort is normal. No respiratory distress.  ?   Breath sounds: Normal breath sounds. No wheezing.  ?Abdominal:  ?   General: Bowel sounds are normal. There is no distension.  ?   Palpations: Abdomen is soft.  ?   Tenderness: There is no abdominal tenderness.  ?Musculoskeletal:     ?   General: No tenderness. Normal range of motion.  ?   Cervical back: Normal range of motion and neck supple.  ?Skin: ?   General: Skin is warm and dry.  ?   Findings: No erythema or rash.  ?Neurological:  ?   Mental Status: He is alert and oriented to person, place, and time.  ?   Cranial Nerves: No cranial nerve deficit.  ?   Deep Tendon Reflexes: Reflexes are normal and symmetric.  ?Psychiatric:     ?   Behavior: Behavior normal.     ?   Thought Content: Thought content normal.     ?   Judgment: Judgment normal.  ? ? ? ?BP 134/84   Pulse 89   Temp 97.8 ?F (36.6 ?C) (Temporal)   Ht 6' (1.829 m)   Wt 284  lb (128.8 kg)   BMI 38.52 kg/m?  ? ? ?   ?Assessment & Plan:  ?Jamahl Lemmons comes in today with chief complaint of Headache and Cyst (On neck /) ? ? ?Diagnosis and orders addressed: ? ?1. Acute nonintractable headache, unspecified headache type ?Rest ?Naproxen as needed ?Stress management  ?- naproxen (NAPROSYN) 500 MG tablet; Take 1 tablet (500 mg total) by mouth 2 (two) times daily with a meal.  Dispense: 60 tablet; Refill: 1 ? ?2. Lipoma of neck ?[place] to confirm Lipoma ?Avoid picking or squeezing  ?- US Soft Tissue Head/Neck (NON-THYROID); Future ? ?3. Mass in neck ?- US Soft Tissue Head/Neck (NON-THYROID); Future ? ? ?Follow up if symptoms worsen or do not improve ? ? ?Evelina Dun, FNP ? ? ? ?

## 2021-10-14 NOTE — Addendum Note (Signed)
Addended by: Antonietta Barcelona D on: 10/14/2021 03:45 PM ? ? Modules accepted: Orders ? ?

## 2021-10-14 NOTE — Progress Notes (Signed)
Rx failed. resent 

## 2021-10-14 NOTE — Patient Instructions (Signed)
Lipoma  A lipoma is a noncancerous (benign) tumor that is made up of fat cells. This is a very common type of soft-tissue growth. Lipomas are usually found under the skin (subcutaneous). They may occur in any tissue of the body that contains fat. Common areas forlipomas to appear include the back, arms, shoulders, buttocks, and thighs. Lipomas grow slowly, and they are usually painless. Most lipomas do not causeproblems and do not require treatment. What are the causes? The cause of this condition is not known. What increases the risk? You are more likely to develop this condition if: You are 40-60 years old. You have a family history of lipomas. What are the signs or symptoms? A lipoma usually appears as a small, round bump under the skin. In most cases, the lump will: Feel soft or rubbery. Not cause pain or other symptoms. However, if a lipoma is located in an area where it pushes on nerves, it canbecome painful or cause other symptoms. How is this diagnosed? A lipoma can usually be diagnosed with a physical exam. You may also have tests to confirm the diagnosis and to rule out other conditions. Tests may include: Imaging tests, such as a CT scan or an MRI. Removal of a tissue sample to be looked at under a microscope (biopsy). How is this treated? Treatment for this condition depends on the size of the lipoma and whether it is causing any symptoms. For small lipomas that are not causing problems, no treatment is needed. If a lipoma is bigger or it causes problems, surgery may be done to remove the lipoma. Lipomas can also be removed to improve appearance. Most often, the procedure is done after applying a medicine that numbs the area (local anesthetic). Liposuction may be done to reduce the size of the lipoma before it is removed through surgery, or it may be done to remove the lipoma. Lipomas are removed with this method in order to limit incision size and scarring. A liposuction tube is  inserted through a small incision into the lipoma, and the contents of the lipoma are removed through the tube with suction. Follow these instructions at home: Watch your lipoma for any changes. Keep all follow-up visits as told by your health care provider. This is important. Contact a health care provider if: Your lipoma becomes larger or hard. Your lipoma becomes painful, red, or increasingly swollen. These could be signs of infection or a more serious condition. Get help right away if: You develop tingling or numbness in an area near the lipoma. This could indicate that the lipoma is causing nerve damage. Summary A lipoma is a noncancerous tumor that is made up of fat cells. Most lipomas do not cause problems and do not require treatment. If a lipoma is bigger or it causes problems, surgery may be done to remove the lipoma. Contact a health care provider if your lipoma becomes larger or hard, or if it becomes painful, red, or increasingly swollen. Pain, redness, and swelling could be signs of infection or a more serious condition. This information is not intended to replace advice given to you by your health care provider. Make sure you discuss any questions you have with your healthcare provider. Document Revised: 02/12/2019 Document Reviewed: 02/12/2019 Elsevier Patient Education  2022 [employer] Inc.  

## 2021-10-19 ENCOUNTER — Ambulatory Visit: Payer: BC Managed Care – PPO | Admitting: Nurse Practitioner

## 2021-10-26 ENCOUNTER — Other Ambulatory Visit: Payer: Self-pay | Admitting: Nurse Practitioner

## 2021-10-26 DIAGNOSIS — J301 Allergic rhinitis due to pollen: Secondary | ICD-10-CM

## 2021-10-27 ENCOUNTER — Ambulatory Visit (HOSPITAL_COMMUNITY)
Admission: RE | Admit: 2021-10-27 | Discharge: 2021-10-27 | Disposition: A | Discharge status: Home / Self Care | Payer: BC Managed Care – PPO | Source: Ambulatory Visit | Attending: Family | Admitting: Family

## 2021-10-27 DIAGNOSIS — D170 Benign lipomatous neoplasm of skin and subcutaneous tissue of head, face and neck: Secondary | ICD-10-CM | POA: Insufficient documentation

## 2021-10-27 DIAGNOSIS — R221 Localized swelling, mass and lump, neck: Secondary | ICD-10-CM | POA: Insufficient documentation

## 2021-10-29 ENCOUNTER — Other Ambulatory Visit: Payer: Self-pay | Admitting: Family Medicine

## 2021-10-29 ENCOUNTER — Telehealth: Payer: Self-pay | Admitting: Family

## 2021-10-29 DIAGNOSIS — D170 Benign lipomatous neoplasm of skin and subcutaneous tissue of head, face and neck: Secondary | ICD-10-CM

## 2021-10-29 NOTE — Telephone Encounter (Signed)
Patient aware per result not. ?

## 2021-11-17 ENCOUNTER — Ambulatory Visit: Payer: BC Managed Care – PPO | Admitting: General Surgery

## 2021-11-26 ENCOUNTER — Encounter: Payer: Self-pay | Admitting: General Surgery

## 2021-11-26 ENCOUNTER — Ambulatory Visit (INDEPENDENT_AMBULATORY_CARE_PROVIDER_SITE_OTHER): Payer: BC Managed Care – PPO | Admitting: General Surgery

## 2021-11-26 VITALS — BP 133/84 | HR 67 | Temp 97.6°F | Resp 12 | Ht 72.0 in | Wt 291.0 lb

## 2021-11-26 DIAGNOSIS — D170 Benign lipomatous neoplasm of skin and subcutaneous tissue of head, face and neck: Secondary | ICD-10-CM | POA: Diagnosis not present

## 2021-11-27 NOTE — Progress Notes (Signed)
Logan Smith; [id number]; 07/23/1970   HPI Patient is a 51 year old black male who was referred to my care by Evelina Dun for evaluation and treatment of a lipoma on the back of his neck.  He states has been present for decades.  No recent growth has been noted.  It is nontender.  He was referred just for evaluation. Past Medical History:  Diagnosis Date   Back pain    Erectile dysfunction    Hyperlipidemia    Hypertension     Past Surgical History:  Procedure Laterality Date   COLONOSCOPY N/A 11/21/2020   Procedure: COLONOSCOPY;  Surgeon: Aviva Signs, MD;  Location: [facility];  Service: Gastroenterology;  Laterality: N/A;   HEMORROIDECTOMY     VASECTOMY      Family History  Problem Relation Age of Onset   Diabetes Mother    Hypertension Mother    Hypertension Father     Current Outpatient Medications on File Prior to Visit  Medication Sig Dispense Refill   amLODipine (NORVASC) 5 MG tablet TAKE 1 TABLET (5 MG TOTAL) BY MOUTH DAILY. 90 tablet 1   benazepril-hydrochlorthiazide (LOTENSIN HCT) 10-12.5 MG tablet Take 1 tablet by mouth daily. 90 tablet 3   cetirizine (ZYRTEC) 10 MG tablet TAKE 1 TABLET BY MOUTH EVERY DAY 30 tablet 11   clindamycin-benzoyl peroxide (BENZACLIN) gel Apply to affected area 2 times daily 50 g 2   fish oil-omega-3 fatty acids 1000 MG capsule Take 2 g by mouth daily.     hydrocortisone (ANUSOL-HC) 25 MG suppository Place 1 suppository (25 mg total) rectally 2 (two) times daily. (Patient taking differently: Place 25 mg rectally 2 (two) times daily as needed for hemorrhoids.) 12 suppository 0   Multiple Vitamins-Minerals (MENS 50+ MULTI VITAMIN/MIN PO) Take 1 tablet by mouth daily.     naproxen (NAPROSYN) 500 MG tablet Take 1 tablet (500 mg total) by mouth 2 (two) times daily with a meal. 60 tablet 1   pramoxine-hydrocortisone (PROCTOCREAM-HC) 1-1 % rectal cream Place 1 application rectally 2 (two) times daily. (Patient taking differently: Place 1  application. rectally 2 (two) times daily as needed for hemorrhoids.) 30 g 0   rosuvastatin (CRESTOR) 10 MG tablet Take 1 tablet (10 mg total) by mouth daily. 90 tablet 3   sildenafil (VIAGRA) 25 MG tablet Take 1 tablet (25 mg total) by mouth daily as needed for erectile dysfunction. 10 tablet 0   No current facility-administered medications on file prior to visit.    No Known Allergies  Social History   Substance and Sexual Activity  Alcohol Use No   Comment: rarely    Social History   Tobacco Use  Smoking Status Never  Smokeless Tobacco Never    Review of Systems  Constitutional: Negative.   HENT: Negative.    Eyes: Negative.   Respiratory: Negative.    Cardiovascular: Negative.   Gastrointestinal: Negative.   Genitourinary: Negative.   Musculoskeletal:  Positive for back pain.  Skin: Negative.   Neurological: Negative.   Endo/Heme/Allergies: Negative.   Psychiatric/Behavioral: Negative.     Objective   Vitals:   11/26/21 0932  BP: 133/84  Pulse: 67  Resp: 12  Temp: 97.6 F (36.4 C)  SpO2: 94%    Physical Exam Vitals reviewed.  Constitutional:      Appearance: Normal appearance. He is obese. He is not ill-appearing.  HENT:     Head: Normocephalic and atraumatic.  Neck:     Comments: 3 cm oval subcutaneous rubbery mass  present in the posterior neck just to the right of the midline.  It is nontender.  No erythema is noted.  No induration is noted. Cardiovascular:     Rate and Rhythm: Normal rate and regular rhythm.     Heart sounds: Normal heart sounds. No murmur heard.   No friction rub. No gallop.  Pulmonary:     Effort: Pulmonary effort is normal. No respiratory distress.     Breath sounds: Normal breath sounds. No stridor. No wheezing, rhonchi or rales.  Musculoskeletal:     Cervical back: Neck supple. No tenderness.  Skin:    General: Skin is warm and dry.  Neurological:     Mental Status: He is alert and oriented to person, place, and time.     Assessment  Lipoma, neck Plan  Patient does not want to have removal of the lipoma unless necessary.  As the lipoma is nontender and has not changed in size, no need for surgical intervention at this time.  Patient was given literature about lipomas.  Follow-up if any changes noted.  He understands and agrees.

## 2021-12-21 ENCOUNTER — Encounter: Payer: Self-pay | Admitting: Family

## 2021-12-21 ENCOUNTER — Ambulatory Visit (INDEPENDENT_AMBULATORY_CARE_PROVIDER_SITE_OTHER): Payer: BC Managed Care – PPO | Admitting: Family

## 2021-12-21 VITALS — BP 137/85 | HR 86 | Temp 97.7°F | Ht 73.0 in | Wt 291.2 lb

## 2021-12-21 DIAGNOSIS — I10 Essential (primary) hypertension: Secondary | ICD-10-CM | POA: Diagnosis not present

## 2021-12-21 DIAGNOSIS — E782 Mixed hyperlipidemia: Secondary | ICD-10-CM | POA: Diagnosis not present

## 2021-12-21 DIAGNOSIS — Z713 Dietary counseling and surveillance: Secondary | ICD-10-CM

## 2021-12-21 MED ORDER — PHENTERMINE HCL 37.5 MG PO TABS: 37.5000 mg | ORAL_TABLET | Freq: Every day | ORAL | 2 refills | Status: DC

## 2021-12-21 NOTE — Patient Instructions (Signed)

## 2021-12-21 NOTE — Progress Notes (Signed)
Subjective:    Patient ID: Logan Smith, male    DOB: February 24, 1971, 51 y.o.   MRN: [id number]  Chief Complaint  Patient presents with   Weight Loss   PT presents to the office today to discuss weight loss. He has been using OTC with no success. He has HTN.   He has started walking every day for 2 miles. Goes to the gym twice a week.   He is morbid obese with a BMI of 38 with HTN and hyperlipidemia.  Hypertension This is a chronic problem. The current episode started more than 1 year ago. The problem has been resolved since onset. The problem is controlled. Pertinent negatives include no malaise/fatigue, peripheral edema or shortness of breath. Risk factors for coronary artery disease include dyslipidemia, obesity and sedentary lifestyle. The current treatment provides moderate improvement.  Hyperlipidemia This is a chronic problem. The current episode started more than 1 year ago. Exacerbating diseases include obesity. Pertinent negatives include no shortness of breath. Current antihyperlipidemic treatment includes statins. The current treatment provides moderate improvement of lipids. Risk factors for coronary artery disease include dyslipidemia, male sex, hypertension and a sedentary lifestyle.      Review of Systems  Constitutional:  Negative for malaise/fatigue.  Respiratory:  Negative for shortness of breath.   All other systems reviewed and are negative.      Objective:   Physical Exam Vitals reviewed.  Constitutional:      General: He is not in acute distress.    Appearance: He is well-developed. He is obese.  HENT:     Head: Normocephalic.     Right Ear: Tympanic membrane normal.     Left Ear: Tympanic membrane normal.  Eyes:     General:        Right eye: No discharge.        Left eye: No discharge.     Pupils: Pupils are equal, round, and reactive to light.  Neck:     Thyroid: No thyromegaly.  Cardiovascular:     Rate and Rhythm: Normal rate and regular  rhythm.     Heart sounds: Normal heart sounds. No murmur heard. Pulmonary:     Effort: Pulmonary effort is normal. No respiratory distress.     Breath sounds: Normal breath sounds. No wheezing.  Abdominal:     General: Bowel sounds are normal. There is no distension.     Palpations: Abdomen is soft.     Tenderness: There is no abdominal tenderness.  Musculoskeletal:        General: No tenderness. Normal range of motion.     Cervical back: Normal range of motion and neck supple.  Skin:    General: Skin is warm and dry.     Findings: No erythema or rash.  Neurological:     Mental Status: He is alert and oriented to person, place, and time.     Cranial Nerves: No cranial nerve deficit.     Deep Tendon Reflexes: Reflexes are normal and symmetric.  Psychiatric:        Behavior: Behavior normal.        Thought Content: Thought content normal.        Judgment: Judgment normal.       BP 137/85   Pulse 86   Temp 97.7 F (36.5 C) (Temporal)   Ht _0  (1.854 m)   Wt 291 lb 3.2 oz (132.1 kg)   SpO2 95%   BMI 38.42 kg/m  Assessment & Plan:  [name] comes in today with chief complaint of Weight Loss   Diagnosis and orders addressed:  1. Primary hypertension - CMP14+EGFR  2. Morbid obesity (HCC) - phentermine (ADIPEX-P) 37.5 MG tablet; Take 1 tablet (37.5 mg total) by mouth daily before breakfast.  Dispense: 30 tablet; Refill: 2 - CMP14+EGFR  3. Mixed hyperlipidemia - CMP14+EGFR  4. Weight loss counseling, encounter for Will start phentermine  Avoid caffeine and energy drinks  Encourage exercise and healthy diet - phentermine (ADIPEX-P) 37.5 MG tablet; Take 1 tablet (37.5 mg total) by mouth daily before breakfast.  Dispense: 30 tablet; Refill: 2 - CMP14+EGFR   Labs pending Health Maintenance reviewed Diet and exercise encouraged  Follow up plan: 3 months    Evelina Dun, FNP

## 2022-03-17 ENCOUNTER — Other Ambulatory Visit: Payer: Self-pay | Admitting: Family

## 2022-03-17 DIAGNOSIS — I10 Essential (primary) hypertension: Secondary | ICD-10-CM

## 2022-03-17 DIAGNOSIS — E782 Mixed hyperlipidemia: Secondary | ICD-10-CM

## 2022-03-22 ENCOUNTER — Encounter: Payer: Self-pay | Admitting: Family

## 2022-03-22 ENCOUNTER — Encounter: Payer: BC Managed Care – PPO | Admitting: Family

## 2022-03-31 ENCOUNTER — Other Ambulatory Visit: Payer: Self-pay | Admitting: Family

## 2022-03-31 DIAGNOSIS — I10 Essential (primary) hypertension: Secondary | ICD-10-CM

## 2022-04-16 ENCOUNTER — Encounter: Payer: BC Managed Care – PPO | Admitting: Family

## 2022-04-16 ENCOUNTER — Encounter: Payer: Self-pay | Admitting: Family

## 2022-04-16 ENCOUNTER — Ambulatory Visit (INDEPENDENT_AMBULATORY_CARE_PROVIDER_SITE_OTHER): Payer: BC Managed Care – PPO | Admitting: Family

## 2022-04-16 VITALS — BP 140/81 | HR 80 | Temp 98.1°F | Ht 73.0 in | Wt 289.0 lb

## 2022-04-16 DIAGNOSIS — G5601 Carpal tunnel syndrome, right upper limb: Secondary | ICD-10-CM

## 2022-04-16 DIAGNOSIS — Z713 Dietary counseling and surveillance: Secondary | ICD-10-CM

## 2022-04-16 DIAGNOSIS — Z23 Encounter for immunization: Secondary | ICD-10-CM

## 2022-04-16 DIAGNOSIS — I10 Essential (primary) hypertension: Secondary | ICD-10-CM

## 2022-04-16 DIAGNOSIS — E782 Mixed hyperlipidemia: Secondary | ICD-10-CM

## 2022-04-16 DIAGNOSIS — Z0001 Encounter for general adult medical examination with abnormal findings: Secondary | ICD-10-CM | POA: Diagnosis not present

## 2022-04-16 DIAGNOSIS — Z0000 Encounter for general adult medical examination without abnormal findings: Secondary | ICD-10-CM

## 2022-04-16 MED ORDER — PHENTERMINE HCL 37.5 MG PO TABS: 37.5000 mg | ORAL_TABLET | Freq: Every day | ORAL | 2 refills | Status: DC

## 2022-04-16 NOTE — Patient Instructions (Signed)

## 2022-04-16 NOTE — Progress Notes (Signed)
Subjective:    Patient ID: Logan Smith, male    DOB: 04-10-1971, 51 y.o.   MRN: [id number]  Chief Complaint  Patient presents with   Annual Exam    3 fingers numb on right hand    PT presents to the office today for CPE. He is considered morbid obese with a BMI of 38 and comorbidity of HTN and hyperlipidemia.    He was taking phentermine and states he lost two pounds. He reports he has not taken in 3 weeks. Would like to try to restart this.   Complaining right numbness and tingling pain in index, middle, and ring finger.  Hypertension This is a chronic problem. The current episode started more than 1 year ago. The problem has been resolved since onset. The problem is controlled. Pertinent negatives include no malaise/fatigue, peripheral edema or shortness of breath. Risk factors for coronary artery disease include dyslipidemia, obesity, male gender and sedentary lifestyle. The current treatment provides moderate improvement.  Hyperlipidemia This is a chronic problem. The current episode started more than 1 year ago. The problem is controlled. Recent lipid tests were reviewed and are normal. Exacerbating diseases include obesity. Pertinent negatives include no shortness of breath. Current antihyperlipidemic treatment includes statins. The current treatment provides moderate improvement of lipids. Risk factors for coronary artery disease include dyslipidemia, male sex, hypertension and a sedentary lifestyle.      Review of Systems  Constitutional:  Negative for malaise/fatigue.  Respiratory:  Negative for shortness of breath.   All other systems reviewed and are negative.      Family History  Problem Relation Age of Onset   Diabetes Mother    Hypertension Mother    Hypertension Father    Social History   Socioeconomic History   Marital status: Single    Spouse name: Not on file   Number of children: Not on file   Years of education: Not on file   Highest education level:  Not on file  Occupational History   Not on file  Tobacco Use   Smoking status: Never   Smokeless tobacco: Never  Substance and Sexual Activity   Alcohol use: No    Comment: rarely   Drug use: No   Sexual activity: Not on file  Other Topics Concern   Not on file  Social History Narrative   Not on file   Social Determinants of Health   Financial Resource Strain: Not on file  Food Insecurity: Not on file  Transportation Needs: Not on file  Physical Activity: Not on file  Stress: Not on file  Social Connections: Not on file    Objective:   Physical Exam Vitals reviewed.  Constitutional:      General: He is not in acute distress.    Appearance: He is well-developed. He is obese.  HENT:     Head: Normocephalic.     Right Ear: Tympanic membrane normal.     Left Ear: Tympanic membrane normal.  Eyes:     General:        Right eye: No discharge.        Left eye: No discharge.     Pupils: Pupils are equal, round, and reactive to light.  Neck:     Thyroid: No thyromegaly.  Cardiovascular:     Rate and Rhythm: Normal rate and regular rhythm.     Heart sounds: Normal heart sounds. No murmur heard. Pulmonary:     Effort: Pulmonary effort is normal. No respiratory distress.  Breath sounds: Normal breath sounds. No wheezing.  Abdominal:     General: Bowel sounds are normal. There is no distension.     Palpations: Abdomen is soft.     Tenderness: There is no abdominal tenderness.  Musculoskeletal:        General: No tenderness. Normal range of motion.     Cervical back: Normal range of motion and neck supple.     Comments: Negative phalen and tinel sign  Skin:    General: Skin is warm and dry.     Findings: No erythema or rash.  Neurological:     Mental Status: He is alert and oriented to person, place, and time.     Cranial Nerves: No cranial nerve deficit.     Deep Tendon Reflexes: Reflexes are normal and symmetric.  Psychiatric:        Behavior: Behavior normal.         Thought Content: Thought content normal.        Judgment: Judgment normal.       BP (!) 140/81   Pulse 80   Temp 98.1 F (36.7 C) (Temporal)   Ht '6\' 1"'  (1.854 m)   Wt 289 lb (131.1 kg)   SpO2 96%   BMI 38.13 kg/m      Assessment & Plan:  Wallis Vancott comes in today with chief complaint of Annual Exam (3 fingers numb on right hand )   Diagnosis and orders addressed:  1. Morbid obesity (HCC) - phentermine (ADIPEX-P) 37.5 MG tablet; Take 1 tablet (37.5 mg total) by mouth daily before breakfast.  Dispense: 30 tablet; Refill: 2 - CMP14+EGFR - CBC with Differential/Platelet  2. Weight loss counseling, encounter for Will restart Phentermine Needs to lose 5% of weight loss to continue  Encourage healthy diet and exercise  - phentermine (ADIPEX-P) 37.5 MG tablet; Take 1 tablet (37.5 mg total) by mouth daily before breakfast.  Dispense: 30 tablet; Refill: 2 - CMP14+EGFR - CBC with Differential/Platelet  3. Need for immunization against influenza - Flu Vaccine QUAD 29moIM (Fluarix, Fluzone & Alfiuria Quad PF) - CMP14+EGFR - CBC with Differential/Platelet  4. Annual physical exam - CMP14+EGFR - CBC with Differential/Platelet - Lipid panel - PSA, total and free - Thyroid Panel With TSH  5. Carpal tunnel syndrome on right -Pain continues, encourage to wear brace while working. Continue NSAID's - CMP14+EGFR - CBC with Differential/Platelet - Ambulatory referral to Hand Surgery  6. Primary hypertension  7. Mixed hyperlipidemia    Labs pending Health Maintenance reviewed Diet and exercise encouraged  Follow up plan: 3 months    CEvelina Dun FNP

## 2022-04-17 LAB — THYROID PANEL WITH TSH
Free Thyroxine Index: 1.1 — ABNORMAL LOW (ref 1.2–4.9)
T3 Uptake Ratio: 25 % (ref 24–39)
T4, Total: 4.3 ug/dL — ABNORMAL LOW (ref 4.5–12.0)
TSH: 1.48 u[IU]/mL (ref 0.450–4.500)

## 2022-04-17 LAB — CBC WITH DIFFERENTIAL/PLATELET
Basophils Absolute: 0 10*3/uL (ref 0.0–0.2)
Basos: 0 %
EOS (ABSOLUTE): 0.1 10*3/uL (ref 0.0–0.4)
Eos: 1 %
Hematocrit: 42.8 % (ref 37.5–51.0)
Hemoglobin: 14.1 g/dL (ref 13.0–17.7)
Immature Grans (Abs): 0 10*3/uL (ref 0.0–0.1)
Immature Granulocytes: 0 %
Lymphocytes Absolute: 2 10*3/uL (ref 0.7–3.1)
Lymphs: 29 %
MCH: 29.7 pg (ref 26.6–33.0)
MCHC: 32.9 g/dL (ref 31.5–35.7)
MCV: 90 fL (ref 79–97)
Monocytes Absolute: 0.5 10*3/uL (ref 0.1–0.9)
Monocytes: 6 %
Neutrophils Absolute: 4.4 10*3/uL (ref 1.4–7.0)
Neutrophils: 64 %
Platelets: 307 10*3/uL (ref 150–450)
RBC: 4.75 x10E6/uL (ref 4.14–5.80)
RDW: 12.2 % (ref 11.6–15.4)
WBC: 7 10*3/uL (ref 3.4–10.8)

## 2022-04-17 LAB — CMP14+EGFR
ALT: 29 IU/L (ref 0–44)
AST: 21 IU/L (ref 0–40)
Albumin/Globulin Ratio: 1.8 (ref 1.2–2.2)
Albumin: 4.5 g/dL (ref 4.1–5.1)
Alkaline Phosphatase: 85 IU/L (ref 44–121)
BUN/Creatinine Ratio: 17 (ref 9–20)
BUN: 16 mg/dL (ref 6–24)
Bilirubin Total: 0.3 mg/dL (ref 0.0–1.2)
CO2: 22 mmol/L (ref 20–29)
Calcium: 9.7 mg/dL (ref 8.7–10.2)
Chloride: 99 mmol/L (ref 96–106)
Creatinine, Ser: 0.92 mg/dL (ref 0.76–1.27)
Globulin, Total: 2.5 g/dL (ref 1.5–4.5)
Glucose: 94 mg/dL (ref 70–99)
Potassium: 4 mmol/L (ref 3.5–5.2)
Sodium: 136 mmol/L (ref 134–144)
Total Protein: 7 g/dL (ref 6.0–8.5)
eGFR: 101 mL/min/{1.73_m2} (ref >59)

## 2022-04-17 LAB — LIPID PANEL
Chol/HDL Ratio: 3.2 ratio (ref 0.0–5.0)
Cholesterol, Total: 130 mg/dL (ref 100–199)
HDL: 41 mg/dL (ref >39)
LDL Chol Calc (NIH): 75 mg/dL (ref 0–99)
Triglycerides: 65 mg/dL (ref 0–149)
VLDL Cholesterol Cal: 14 mg/dL (ref 5–40)

## 2022-04-17 LAB — PSA, TOTAL AND FREE
PSA, Free Pct: 14.6 %
PSA, Free: 0.92 ng/mL
Prostate Specific Ag, Serum: 6.3 ng/mL — ABNORMAL HIGH (ref 0.0–4.0)

## 2022-04-19 ENCOUNTER — Other Ambulatory Visit: Payer: Self-pay | Admitting: Family

## 2022-04-19 DIAGNOSIS — R972 Elevated prostate specific antigen [PSA]: Secondary | ICD-10-CM

## 2022-04-20 NOTE — Progress Notes (Signed)
Patient returning call. Please call back

## 2022-04-30 ENCOUNTER — Encounter: Payer: Self-pay | Admitting: Urology

## 2022-04-30 ENCOUNTER — Ambulatory Visit (INDEPENDENT_AMBULATORY_CARE_PROVIDER_SITE_OTHER): Payer: BC Managed Care – PPO | Admitting: Urology

## 2022-04-30 VITALS — BP 118/77 | HR 84 | Ht 72.0 in | Wt 250.0 lb

## 2022-04-30 DIAGNOSIS — R972 Elevated prostate specific antigen [PSA]: Secondary | ICD-10-CM | POA: Diagnosis not present

## 2022-04-30 LAB — URINALYSIS, ROUTINE W REFLEX MICROSCOPIC
Bilirubin, UA: NEGATIVE
Glucose, UA: NEGATIVE
Ketones, UA: NEGATIVE
Leukocytes,UA: NEGATIVE
Nitrite, UA: NEGATIVE
Protein,UA: NEGATIVE
RBC, UA: NEGATIVE
Specific Gravity, UA: 1.015 (ref 1.005–1.030)
Urobilinogen, Ur: 0.2 mg/dL (ref 0.2–1.0)
pH, UA: 5.5 (ref 5.0–7.5)

## 2022-04-30 NOTE — Addendum Note (Signed)
Addended by: Audie Box on: 04/30/2022 10:50 AM   Modules accepted: Orders

## 2022-04-30 NOTE — Progress Notes (Signed)
Assessment: 1. Elevated PSA     Plan: I personally reviewed the patient records including available PSA results. Today I had a long discussion with the patient regarding PSA and the rationale and controversies of prostate cancer early detection.  I discussed the pros and cons of further evaluation including TRUS and prostate Bx.  Potential adverse events and complications as well as standard instructions were given.  Patient expressed his understanding of these issues. Schedule for TRUS/BX  Chief Complaint:  Chief Complaint  Patient presents with   Elevated PSA    History of Present Illness:  Logan Smith is a 51 y.o. male who is seen in consultation from Sharion Balloon, FNP for evaluation of elevated PSA.  PSA results: 9/14 1.7 7/21 4.3 with 19.3% free 9/22 4.1 with 16.8% free 10/23 6.3 with 14.6% free  No prior prostate biopsy.  No history of UTIs or prostatitis.  No family history of prostate cancer. He reports some daytime frequency.  No other significant urinary symptoms.  No dysuria or gross hematuria. IPSS = 3 today.   Past Medical History:  Past Medical History:  Diagnosis Date   Back pain    Erectile dysfunction    Hyperlipidemia    Hypertension     Past Surgical History:  Past Surgical History:  Procedure Laterality Date   COLONOSCOPY N/A 11/21/2020   Procedure: COLONOSCOPY;  Surgeon: Aviva Signs, MD;  Location: [facility];  Service: Gastroenterology;  Laterality: N/A;   HEMORROIDECTOMY     VASECTOMY      Allergies:  No Known Allergies  Family History:  Family History  Problem Relation Age of Onset   Diabetes Mother    Hypertension Mother    Hypertension Father     Social History:  Social History   Tobacco Use   Smoking status: Never   Smokeless tobacco: Never  Substance Use Topics   Alcohol use: No    Comment: rarely   Drug use: No    Review of symptoms:  Constitutional:  Negative for unexplained weight loss, night  sweats, fever, chills ENT:  Negative for nose bleeds, sinus pain, painful swallowing CV:  Negative for chest pain, shortness of breath, exercise intolerance, palpitations, loss of consciousness Resp:  Negative for cough, wheezing, shortness of breath GI:  Negative for nausea, vomiting, diarrhea, bloody stools GU:  Positives noted in HPI; otherwise negative for gross hematuria, dysuria, urinary incontinence Neuro:  Negative for seizures, poor balance, limb weakness, slurred speech Psych:  Negative for lack of energy, depression, anxiety Endocrine:  Negative for polydipsia, polyuria, symptoms of hypoglycemia (dizziness, hunger, sweating) Hematologic:  Negative for anemia, purpura, petechia, prolonged or excessive bleeding, use of anticoagulants  Allergic:  Negative for difficulty breathing or choking as a result of exposure to anything; no shellfish allergy; no allergic response (rash/itch) to materials, foods  Physical exam: BP 118/77   Pulse 84   Ht 6' (1.829 m)   Wt 250 lb (113.4 kg)   BMI 33.91 kg/m  GENERAL APPEARANCE:  Well appearing, well developed, well nourished, NAD HEENT: Atraumatic, Normocephalic, oropharynx clear. NECK: Supple without lymphadenopathy or thyromegaly. LUNGS: Clear to auscultation bilaterally. HEART: Regular Rate and Rhythm without murmurs, gallops, or rubs. ABDOMEN: Soft, non-tender, No Masses. EXTREMITIES: Moves all extremities well.  Without clubbing, cyanosis, or edema. NEUROLOGIC:  Alert and oriented x 3, normal gait, CN II-XII grossly intact.  MENTAL STATUS:  Appropriate. BACK:  Non-tender to palpation.  No CVAT SKIN:  Warm, dry and intact.  GU: Penis:  circumcised Meatus: Normal Scrotum: normal, no masses Testis: normal without masses bilateral Epididymis: normal Prostate: difficult to feel base, NT, no nodules Rectum: Normal tone,  no masses or tenderness    Results: U/A: Dipstick negative

## 2022-04-30 NOTE — Patient Instructions (Addendum)
   Appointment Time:0800 Appointment Date:05/06/22  Location: [facility]   Prostate Biopsy Instructions  Stop all aspirin or blood thinners (aspirin, plavix, coumadin, warfarin, motrin, ibuprofen, advil, aleve, naproxen, naprosyn) for 7 days prior to the procedure.  If you have any questions about stopping these medications, please contact your primary care physician or cardiologist.  Having a light meal prior to the procedure is recommended.  If you are diabetic or have low blood sugar please bring a small snack or glucose tablet.  A Fleets enema is needed to be purchased over the counter at a local pharmacy and used 2 hours before you scheduled appointment.  This can be purchased over the counter at any pharmacy.  Please bring someone with you to the procedure to drive you home if you are given a valium to take prior to your procedure.   If you have any questions or concerns, please feel free to call the office at [phone] or send a Mychart message.    Thank you, [facility]

## 2022-05-06 ENCOUNTER — Ambulatory Visit (HOSPITAL_COMMUNITY)
Admission: RE | Admit: 2022-05-06 | Discharge: 2022-05-06 | Disposition: A | Discharge status: Home / Self Care | Payer: BC Managed Care – PPO | Source: Ambulatory Visit | Attending: Urology | Admitting: Urology

## 2022-05-06 ENCOUNTER — Encounter (HOSPITAL_COMMUNITY): Payer: Self-pay

## 2022-05-06 ENCOUNTER — Ambulatory Visit (HOSPITAL_BASED_OUTPATIENT_CLINIC_OR_DEPARTMENT_OTHER): Payer: BC Managed Care – PPO | Admitting: Urology

## 2022-05-06 ENCOUNTER — Encounter: Payer: Self-pay | Admitting: Urology

## 2022-05-06 ENCOUNTER — Other Ambulatory Visit: Payer: Self-pay | Admitting: Urology

## 2022-05-06 DIAGNOSIS — R972 Elevated prostate specific antigen [PSA]: Secondary | ICD-10-CM | POA: Insufficient documentation

## 2022-05-06 DIAGNOSIS — D291 Benign neoplasm of prostate: Secondary | ICD-10-CM | POA: Diagnosis not present

## 2022-05-06 MED ORDER — CEFTRIAXONE SODIUM 1 G IJ SOLR: 1.0000 g | Freq: Once | INTRAMUSCULAR | Status: AC

## 2022-05-06 MED ORDER — LIDOCAINE HCL (PF) 2 % IJ SOLN
INTRAMUSCULAR | Status: AC
  Administered 2022-05-06: 10 mL
  Filled 2022-05-06: qty 10

## 2022-05-06 MED ORDER — LIDOCAINE HCL (PF) 1 % IJ SOLN
INTRAMUSCULAR | Status: AC
  Administered 2022-05-06: 2.1 mL
  Filled 2022-05-06: qty 5

## 2022-05-06 MED ORDER — LIDOCAINE HCL (PF) 1 % IJ SOLN: 2.1000 mL | Freq: Once | INTRAMUSCULAR | Status: AC

## 2022-05-06 MED ORDER — CEFTRIAXONE SODIUM 1 G IJ SOLR
INTRAMUSCULAR | Status: AC
  Administered 2022-05-06: 1 g via INTRAMUSCULAR
  Filled 2022-05-06: qty 10

## 2022-05-06 MED ORDER — LIDOCAINE HCL (PF) 2 % IJ SOLN: 10.0000 mL | Freq: Once | INTRAMUSCULAR | Status: AC

## 2022-05-06 NOTE — Progress Notes (Signed)
PT tolerated prostate biopsy procedure and antibiotic injection well today. Labs obtained and sent for pathology. PT ambulatory at discharge with no acute distress noted and verbalized understanding of discharge instructions. PT to follow up with urologist as scheduled on 05/18/22 '@3'$ :30pm.

## 2022-05-06 NOTE — Progress Notes (Signed)
Assessment: 1. Elevated PSA     Plan: Postbiopsy instructions given. Return to office in 7-10 days for biopsy results.  Chief Complaint:  Chief Complaint  Patient presents with   Prostate Biopsy    History of Present Illness:  Logan Smith is a 51 y.o. male who is seen for further evaluation of elevated PSA wit prostate biopsy.  PSA results: 9/14 1.7 7/21 4.3 with 19.3% free 9/22 4.1 with 16.8% free 10/23 6.3 with 14.6% free  No prior prostate biopsy.  No history of UTIs or prostatitis.  No family history of prostate cancer. He reports some daytime frequency.  No other significant urinary symptoms.  No dysuria or gross hematuria. IPSS = 3.  He presents today for a prostate ultrasound and biopsy.  Portions of the above documentation were copied from a prior visit for review purposes only.   Past Medical History:  Past Medical History:  Diagnosis Date   Back pain    Erectile dysfunction    Hyperlipidemia    Hypertension     Past Surgical History:  Past Surgical History:  Procedure Laterality Date   COLONOSCOPY N/A 11/21/2020   Procedure: COLONOSCOPY;  Surgeon: Aviva Signs, MD;  Location: [facility];  Service: Gastroenterology;  Laterality: N/A;   HEMORROIDECTOMY     VASECTOMY      Allergies:  No Known Allergies  Family History:  Family History  Problem Relation Age of Onset   Diabetes Mother    Hypertension Mother    Hypertension Father     Social History:  Social History   Tobacco Use   Smoking status: Never   Smokeless tobacco: Never  Vaping Use   Vaping Use: Never used  Substance Use Topics   Alcohol use: No    Comment: rarely   Drug use: No   ROS: Constitutional:  Negative for fever, chills, weight loss CV: Negative for chest pain, previous MI, hypertension Respiratory:  Negative for shortness of breath, wheezing, sleep apnea, frequent cough GI:  Negative for nausea, vomiting, bloody stool, GERD  Physical exam: GENERAL  APPEARANCE:  Well appearing, well developed, well nourished, NAD HEENT:  Atraumatic, normocephalic, oropharynx clear NECK:  Supple without lymphadenopathy or thyromegaly ABDOMEN:  Soft, non-tender, no masses EXTREMITIES:  Moves all extremities well, without clubbing, cyanosis, or edema NEUROLOGIC:  Alert and oriented x 3, normal gait, CN II-XII grossly intact MENTAL STATUS:  appropriate BACK:  Non-tender to palpation, No CVAT SKIN:  Warm, dry, and intact    Results: None  TRANSRECTAL ULTRASOUND AND PROSTATE BIOPSY  Indication:  Elevated PSA  Prophylactic antibiotic administration: Rocephin  All medications that could result in increased bleeding were discontinued within an appropriate period of the time of biopsy.  Risk including bleeding and infection were discussed.  Informed consent was obtained.  The patient was placed in the left lateral decubitus position.  PROCEDURE 1.  TRANSRECTAL ULTRASOUND OF THE PROSTATE  The 7 MHz transrectal probe was used to image the prostate.  Anal stenosis was not noted.  TRUS volume: 79.1 ml  Hypoechoic areas: None  Hyperechoic areas: None  Central calcifications: not present  Margins:  normal   PROCEDURE 2:  PROSTATE BIOPSY  A periprostatic block was performed using 1% lidocaine and transrectal ultrasound guidance. Under transrectal ultrasound guidance, and using the Biopty gun, prostate biopsies were obtained systematically from the apex, mid gland, and base bilaterally.  A total of 12 cores were obtained.  Hemostasis was obtained with gentle pressure on the prostate.  The procedures were well-tolerated.  No significant bleeding was noted at the end of the procedure.  The patient was stable for discharge from the office.

## 2022-05-13 ENCOUNTER — Encounter: Payer: Self-pay | Admitting: Orthopaedic Surgery

## 2022-05-13 ENCOUNTER — Ambulatory Visit (INDEPENDENT_AMBULATORY_CARE_PROVIDER_SITE_OTHER): Payer: BC Managed Care – PPO | Admitting: Orthopaedic Surgery

## 2022-05-13 VITALS — Ht 72.0 in | Wt 270.0 lb

## 2022-05-13 DIAGNOSIS — G5601 Carpal tunnel syndrome, right upper limb: Secondary | ICD-10-CM | POA: Diagnosis not present

## 2022-05-13 NOTE — Progress Notes (Signed)
Office Visit Note   Patient: Logan Smith           Date of Birth: January 09, 1971           MRN: [id number] Visit Date: 05/13/2022              Requested by: Sharion Balloon, [address] [place],  [place] [address] PCP: Sharion Balloon, FNP   Assessment & Plan: Visit Diagnoses:  1. Carpal tunnel syndrome on right     Plan: Patient had symptoms for 2 years with hand numbness progressive bothers him on a daily basis.  We will set him up for nerve conduction velocities for evaluation of carpal tunnel and office follow-up after studies.  We discussed treatment options including surgical carpal tunnel release.  Follow-Up Instructions: No follow-ups on file.   Orders:  No orders of the defined types were placed in this encounter.  No orders of the defined types were placed in this encounter.     Procedures: No procedures performed   Clinical Data: No additional findings.   Subjective: Chief Complaint  Patient presents with   Right Hand - Numbness    HPI 51 year old male seen with right hand numbness primarily index long finger some ring finger.  He has had symptoms for about 2 years he is worn a splint that he is not sure really helps.  Does not really wake him up at night bothers him on a daily basis.  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  Hand bothers him with activity.  He has not really noticed that he is dropped objects.  He denies neck pain associated symptoms.  He was sent here for evaluation for carpal tunnel on the right hand.  Denies left hand symptoms.  Review of Systems past history of hypertension hyperlipidemia.  All systems noncontributory to HPI.   Objective: Vital Signs: Ht 6' (1.829 m)   Wt 270 lb (122.5 kg)   BMI 36.62 kg/m   Physical Exam Constitutional:      Appearance: He is well-developed.  HENT:     Head: Normocephalic and  atraumatic.     Right Ear: External ear normal.     Left Ear: External ear normal.  Eyes:     Pupils: Pupils are equal, round, and reactive to light.  Neck:     Thyroid: No thyromegaly.     Trachea: No tracheal deviation.  Cardiovascular:     Rate and Rhythm: Normal rate.  Pulmonary:     Effort: Pulmonary effort is normal.     Breath sounds: No wheezing.  Abdominal:     General: Bowel sounds are normal.     Palpations: Abdomen is soft.  Musculoskeletal:     Cervical back: Neck supple.  Skin:    General: Skin is warm and dry.     Capillary Refill: Capillary refill takes less than 2 seconds.  Neurological:     Mental Status: He is alert and oriented to person, place, and time.  Psychiatric:        Behavior: Behavior normal.        Thought Content: Thought content normal.        Judgment: Judgment normal.     Ortho Exam patient has some pain with carpal compression on the right negative the left no thenar atrophy.  Decreased sensation median distribution right hand.  Normal sensation to the small finger.  Ulnar nerve at the elbow is normal.  Good cervical range of motion negative Spurling full flexion no brachial plexus tenderness right or left.  Specialty Comments:  No specialty comments available.  Imaging: No results found.   PMFS History: Patient Active Problem List   Diagnosis Date Noted   Elevated PSA 04/30/2022   Carpal tunnel syndrome on right 11/10/2018   Morbid obesity ([place]) 01/30/2016   Condyloma acuminatum - perianal & anal canal s/p ablation 12/14/2013 11/28/2013   Hemorrhoids, internal 11/28/2013   Hypertension 01/05/2013   Hyperlipemia 01/05/2013   ED (erectile dysfunction) 01/05/2013   Past Medical History:  Diagnosis Date   Back pain    Erectile dysfunction    Hyperlipidemia    Hypertension     Family History  Problem Relation Age of Onset   Diabetes Mother    Hypertension Mother    Hypertension Father     Past Surgical History:  Procedure  Laterality Date   COLONOSCOPY N/A 11/21/2020   Procedure: COLONOSCOPY;  Surgeon: Aviva Signs, MD;  [facility];  Service: Gastroenterology;  Laterality: N/A;   HEMORROIDECTOMY     VASECTOMY     Social History   Occupational History   Not on file  Tobacco Use   Smoking status: Never   Smokeless tobacco: Never  Vaping Use   Vaping Use: Never used  Substance and Sexual Activity   Alcohol use: No    Comment: rarely   Drug use: No   Sexual activity: Not on file

## 2022-05-13 NOTE — Addendum Note (Signed)
Addended by: Meyer Cory on: 05/13/2022 11:01 AM   Modules accepted: Orders

## 2022-05-18 ENCOUNTER — Ambulatory Visit: Payer: BC Managed Care – PPO | Admitting: Urology

## 2022-05-18 ENCOUNTER — Ambulatory Visit (INDEPENDENT_AMBULATORY_CARE_PROVIDER_SITE_OTHER): Payer: BC Managed Care – PPO | Admitting: Physical Medicine and Rehabilitation

## 2022-05-18 DIAGNOSIS — R202 Paresthesia of skin: Secondary | ICD-10-CM

## 2022-05-18 NOTE — Procedures (Signed)
EMG & NCV Findings: Evaluation of the right median (across palm) sensory nerve showed no response (Palm) and prolonged distal peak latency (4.9 ms).  All remaining nerves (as indicated in the following tables) were within normal limits.    All examined muscles (as indicated in the following table) showed no evidence of electrical instability.    Impression: The above electrodiagnostic study is ABNORMAL and reveals evidence of a mild to moderate right median nerve entrapment at the wrist (carpal tunnel syndrome) affecting sensory components. There is no significant electrodiagnostic evidence of any other focal nerve entrapment, brachial plexopathy or cervical radiculopathy in the right upper limb.   Recommendations: 1.  Follow-up with referring physician. 2.  Continue current management of symptoms. 3.  Continue use of resting splint at night-time and as needed during the day.  ___________________________ Logan Smith Board Certified, American Board of Physical Medicine and Rehabilitation    Nerve Conduction Studies Anti Sensory Summary Table   Stim Site NR Peak (ms) Norm Peak (ms) P-T Amp (V) Norm P-T Amp Site1 Site2 Delta-P (ms) Dist (cm) Vel (m/s) Norm Vel (m/s)  Right Median Acr Palm Anti Sensory (2nd Digit)  32.3C  Wrist    *4.9 <3.6 16.2 >10 Wrist Palm  0.0    Palm *NR  <2.0          Right Radial Anti Sensory (Base 1st Digit)  32.4C  Wrist    2.3 <3.1 14.2  Wrist Base 1st Digit 2.3 0.0    Right Ulnar Anti Sensory (5th Digit)  32.7C  Wrist    3.4 <3.7 22.4 >15.0 Wrist 5th Digit 3.4 14.0 41 >38   Motor Summary Table   Stim Site NR Onset (ms) Norm Onset (ms) O-P Amp (mV) Norm O-P Amp Site1 Site2 Delta-0 (ms) Dist (cm) Vel (m/s) Norm Vel (m/s)  Right Median Motor (Abd Poll Brev)  32.5C  Wrist    4.0 <4.2 6.9 >5 Elbow Wrist 5.1 25.5 50 >50  Elbow    9.1  6.3         Right Ulnar Motor (Abd Dig Min)  Wrist    3.4 <4.2 4.2 >3 B Elbow Wrist 4.7 26.0 55 >53  B Elbow     8.1  7.4  A Elbow B Elbow 1.5 11.0 73 >53  A Elbow    9.6  7.2          EMG   Side Muscle Nerve Root Ins Act Fibs Psw Amp Dur Poly Recrt Int Fraser Din Comment  Right Abd Poll Brev Median C8-T1 Nml Nml Nml Nml Nml 0 Nml Nml   Right 1stDorInt Ulnar C8-T1 Nml Nml Nml Nml Nml 0 Nml Nml   Right PronatorTeres Median C6-7 Nml Nml Nml Nml Nml 0 Nml Nml   Right Biceps Musculocut C5-6 Nml Nml Nml Nml Nml 0 Nml Nml   Right Deltoid Axillary C5-6 Nml Nml Nml Nml Nml 0 Nml Nml     Nerve Conduction Studies Anti Sensory Left/Right Comparison   Stim Site L Lat (ms) R Lat (ms) L-R Lat (ms) L Amp (V) R Amp (V) L-R Amp (%) Site1 Site2 L Vel (m/s) R Vel (m/s) L-R Vel (m/s)  Median Acr Palm Anti Sensory (2nd Digit)  32.3C  Wrist  *4.9   16.2  Wrist Palm     Palm             Radial Anti Sensory (Base 1st Digit)  32.4C  Wrist  2.3   14.2  Wrist  Base 1st Digit     Ulnar Anti Sensory (5th Digit)  32.7C  Wrist  3.4   22.4  Wrist 5th Digit  41    Motor Left/Right Comparison   Stim Site L Lat (ms) R Lat (ms) L-R Lat (ms) L Amp (mV) R Amp (mV) L-R Amp (%) Site1 Site2 L Vel (m/s) R Vel (m/s) L-R Vel (m/s)  Median Motor (Abd Poll Brev)  32.5C  Wrist  4.0   6.9  Elbow Wrist  50   Elbow  9.1   6.3        Ulnar Motor (Abd Dig Min)  Wrist  3.4   4.2  B Elbow Wrist  55   B Elbow  8.1   7.4  A Elbow B Elbow  73   A Elbow  9.6   7.2           Waveforms:

## 2022-05-18 NOTE — Progress Notes (Signed)
Numeric Pain Rating Scale and Functional Assessment Average Pain 0   In the last MONTH (on 0-10 scale) has pain interfered with the following?  1. General activity like being  able to carry out your everyday physical activities such as walking, climbing stairs, carrying groceries, or moving a chair?  Rating(7)   Left handed. Pain and numbness in first 3 digits on right hand

## 2022-05-18 NOTE — Progress Notes (Signed)
Logan Smith - 51 y.o. male MRN [id number]  Date of birth: 1970-09-03  Office Visit Note: Visit Date: 05/18/2022 PCP: Sharion Balloon, FNP Referred by: Marybelle Killings, MD  Subjective: Chief Complaint  Patient presents with   Right Wrist - Pain, Numbness   HPI:  Logan Smith is a 51 y.o. male who comes in today at the request of Dr. Rodell Perna for electrodiagnostic study of the Right upper extremities.  Patient is Right hand dominant. He reports numbness more in the radial digits.  He has had symptoms for about 2 years.  He has worn hand splints without much relief.  He has not had prior electrodiagnostic studies.  Hand bothers him with activity.  He has not really noticed that he is dropped objects.  He denies neck pain associated symptoms.     ROS Otherwise per HPI.  Assessment & Plan: Visit Diagnoses:    ICD-10-CM   1. Paresthesia of skin  R20.2 NCV with EMG (electromyography)      Plan: Impression: The above electrodiagnostic study is ABNORMAL and reveals evidence of a mild to moderate right median nerve entrapment at the wrist (carpal tunnel syndrome) affecting sensory components. There is no significant electrodiagnostic evidence of any other focal nerve entrapment, brachial plexopathy or cervical radiculopathy in the right upper limb.   Recommendations: 1.  Follow-up with referring physician. 2.  Continue current management of symptoms. 3.  Continue use of resting splint at night-time and as needed during the day.  Meds & Orders: No orders of the defined types were placed in this encounter.   Orders Placed This Encounter  Procedures   NCV with EMG (electromyography)    Follow-up: Return in about 2 weeks (around 06/01/2022) for Rodell Perna, MD.   Procedures: No procedures performed  EMG & NCV Findings: Evaluation of the right median (across palm) sensory nerve showed no response (Palm) and prolonged distal peak latency (4.9 ms).  All remaining nerves (as indicated  in the following tables) were within normal limits.    All examined muscles (as indicated in the following table) showed no evidence of electrical instability.    Impression: The above electrodiagnostic study is ABNORMAL and reveals evidence of a mild to moderate right median nerve entrapment at the wrist (carpal tunnel syndrome) affecting sensory components. There is no significant electrodiagnostic evidence of any other focal nerve entrapment, brachial plexopathy or cervical radiculopathy in the right upper limb.   Recommendations: 1.  Follow-up with referring physician. 2.  Continue current management of symptoms. 3.  Continue use of resting splint at night-time and as needed during the day.  ___________________________ Wonda Olds Board Certified, American Board of Physical Medicine and Rehabilitation    Nerve Conduction Studies Anti Sensory Summary Table   Stim Site NR Peak (ms) Norm Peak (ms) P-T Amp (V) Norm P-T Amp Site1 Site2 Delta-P (ms) Dist (cm) Vel (m/s) Norm Vel (m/s)  Right Median Acr Palm Anti Sensory (2nd Digit)  32.3C  Wrist    *4.9 <3.6 16.2 >10 Wrist Palm  0.0    Palm *NR  <2.0          Right Radial Anti Sensory (Base 1st Digit)  32.4C  Wrist    2.3 <3.1 14.2  Wrist Base 1st Digit 2.3 0.0    Right Ulnar Anti Sensory (5th Digit)  32.7C  Wrist    3.4 <3.7 22.4 >15.0 Wrist 5th Digit 3.4 14.0 41 >38   Motor Summary Table   Stim  Site NR Onset (ms) Norm Onset (ms) O-P Amp (mV) Norm O-P Amp Site1 Site2 Delta-0 (ms) Dist (cm) Vel (m/s) Norm Vel (m/s)  Right Median Motor (Abd Poll Brev)  32.5C  Wrist    4.0 <4.2 6.9 >5 Elbow Wrist 5.1 25.5 50 >50  Elbow    9.1  6.3         Right Ulnar Motor (Abd Dig Min)  Wrist    3.4 <4.2 4.2 >3 B Elbow Wrist 4.7 26.0 55 >53  B Elbow    8.1  7.4  A Elbow B Elbow 1.5 11.0 73 >53  A Elbow    9.6  7.2          EMG   Side Muscle Nerve Root Ins Act Fibs Psw Amp Dur Poly Recrt Int Fraser Din Comment  Right Abd Poll Brev Median  C8-T1 Nml Nml Nml Nml Nml 0 Nml Nml   Right 1stDorInt Ulnar C8-T1 Nml Nml Nml Nml Nml 0 Nml Nml   Right PronatorTeres Median C6-7 Nml Nml Nml Nml Nml 0 Nml Nml   Right Biceps Musculocut C5-6 Nml Nml Nml Nml Nml 0 Nml Nml   Right Deltoid Axillary C5-6 Nml Nml Nml Nml Nml 0 Nml Nml     Nerve Conduction Studies Anti Sensory Left/Right Comparison   Stim Site L Lat (ms) R Lat (ms) L-R Lat (ms) L Amp (V) R Amp (V) L-R Amp (%) Site1 Site2 L Vel (m/s) R Vel (m/s) L-R Vel (m/s)  Median Acr Palm Anti Sensory (2nd Digit)  32.3C  Wrist  *4.9   16.2  Wrist Palm     Palm             Radial Anti Sensory (Base 1st Digit)  32.4C  Wrist  2.3   14.2  Wrist Base 1st Digit     Ulnar Anti Sensory (5th Digit)  32.7C  Wrist  3.4   22.4  Wrist 5th Digit  41    Motor Left/Right Comparison   Stim Site L Lat (ms) R Lat (ms) L-R Lat (ms) L Amp (mV) R Amp (mV) L-R Amp (%) Site1 Site2 L Vel (m/s) R Vel (m/s) L-R Vel (m/s)  Median Motor (Abd Poll Brev)  32.5C  Wrist  4.0   6.9  Elbow Wrist  50   Elbow  9.1   6.3        Ulnar Motor (Abd Dig Min)  Wrist  3.4   4.2  B Elbow Wrist  55   B Elbow  8.1   7.4  A Elbow B Elbow  73   A Elbow  9.6   7.2           Waveforms:             Clinical History: No specialty comments available.     Objective:  VS:  HT:    WT:   BMI:     BP:   HR: bpm  TEMP: ( )  RESP:  Physical Exam Musculoskeletal:        General: No tenderness.     Comments: Inspection reveals no atrophy of the bilateral APB or FDI or hand intrinsics. There is no swelling, color changes, allodynia or dystrophic changes. There is 5 out of 5 strength in the bilateral wrist extension, finger abduction and long finger flexion. There is intact sensation to light touch in all dermatomal and peripheral nerve distributions.  There is a negative Hoffmann's test bilaterally.  Skin:  General: Skin is warm and dry.     Findings: No erythema or rash.  Neurological:     General: No focal deficit  present.     Mental Status: He is alert and oriented to person, place, and time.     Sensory: No sensory deficit.     Motor: No weakness or abnormal muscle tone.     Coordination: Coordination normal.     Gait: Gait normal.  Psychiatric:        Mood and Affect: Mood normal.        Behavior: Behavior normal.        Thought Content: Thought content normal.      Imaging: No results found.

## 2022-05-20 ENCOUNTER — Telehealth: Payer: Self-pay | Admitting: Orthopaedic Surgery

## 2022-05-20 NOTE — Telephone Encounter (Signed)
I left voicemail advising patient he can call [facility] office and schedule follow up to review NCS.  Charlotte in [place] office is aware. Asked him to return call if other questions or problems.

## 2022-05-20 NOTE — Telephone Encounter (Signed)
Patient called in wondering when he was supposed to follow up in [place] and would like a call to set that up also has question about back pain please advise

## 2022-06-06 IMAGING — US US SOFT TISSUE HEAD/NECK
1 series · 6 of 6 positions shown · non-contrast
Comparison: None.

CLINICAL DATA: right probable lipoma

EXAM:
ULTRASOUND OF HEAD/NECK SOFT TISSUES
TECHNIQUE: Ultrasound examination of the head and neck soft tissues was
performed in the area of clinical concern.

[Series 1: us soft tissue head & neck (non-thyroid) · 6 of 6 slices shown]
[im 1/6]
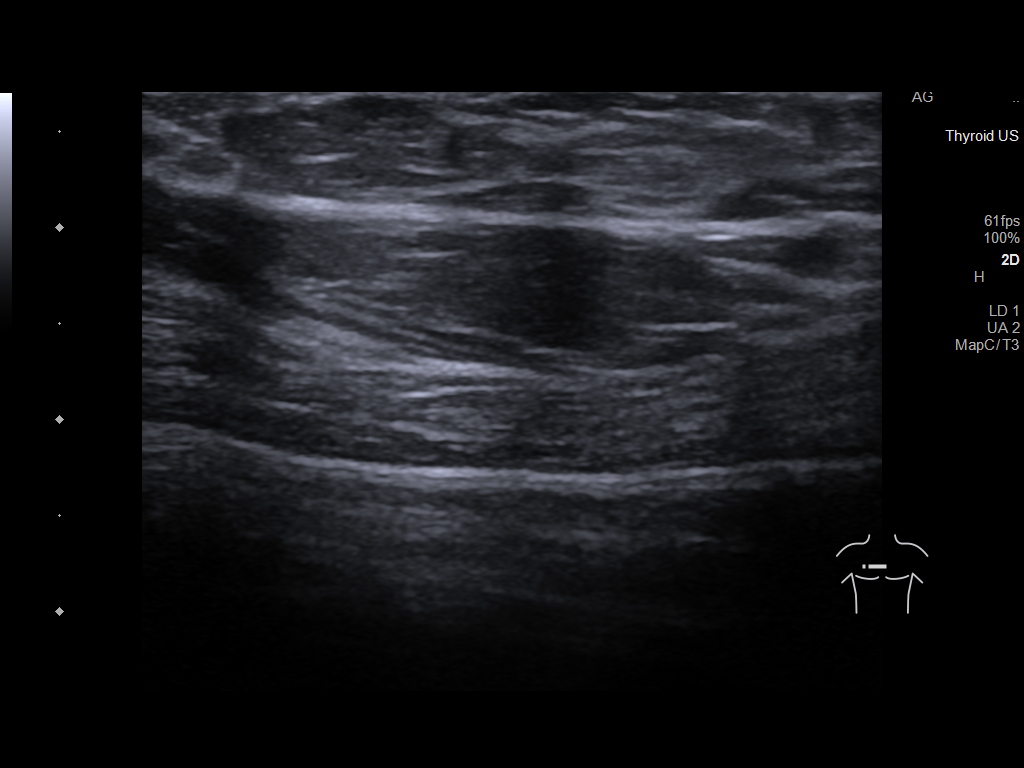
[im 2/6]
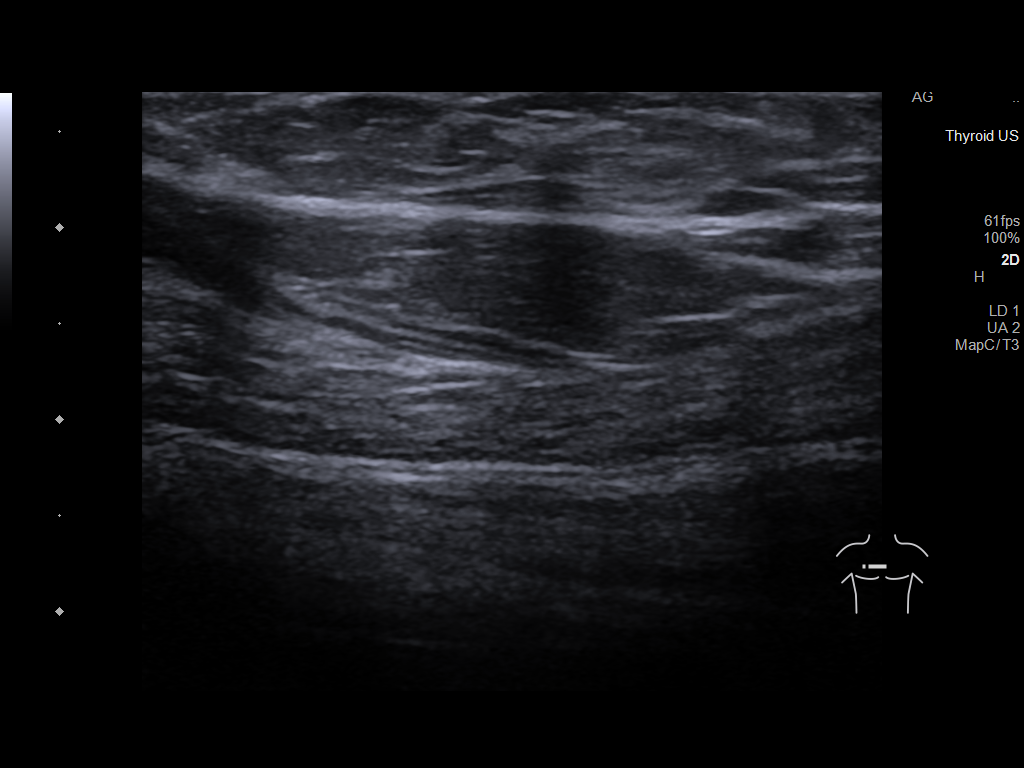
[im 3/6]
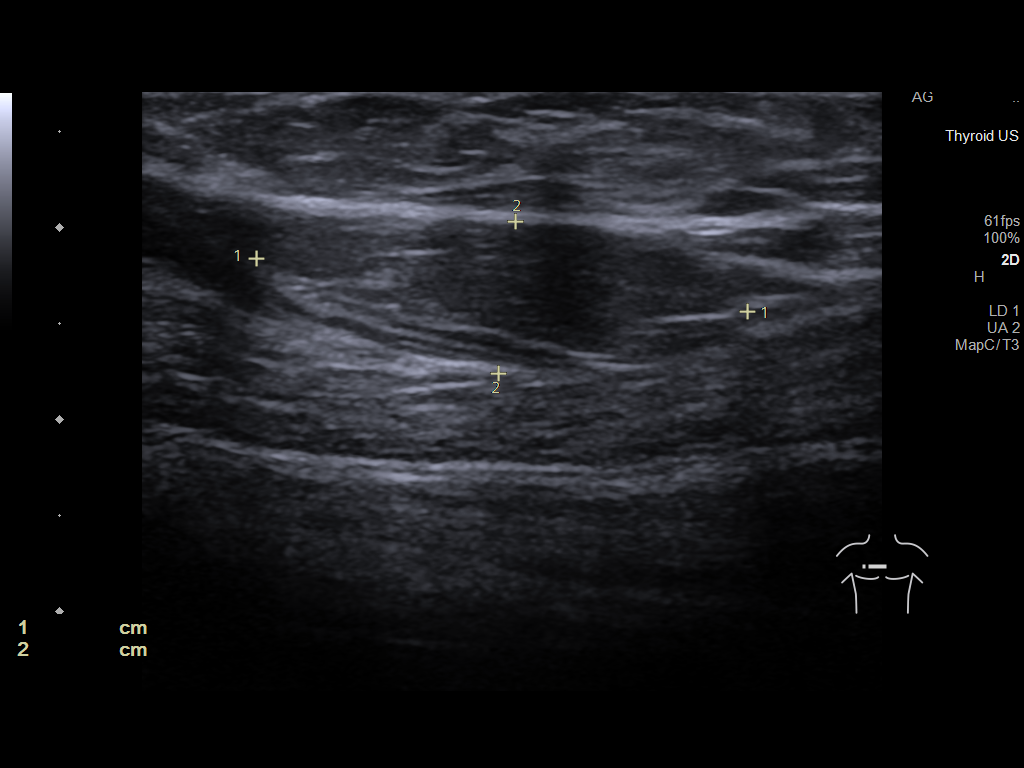
[im 4/6]
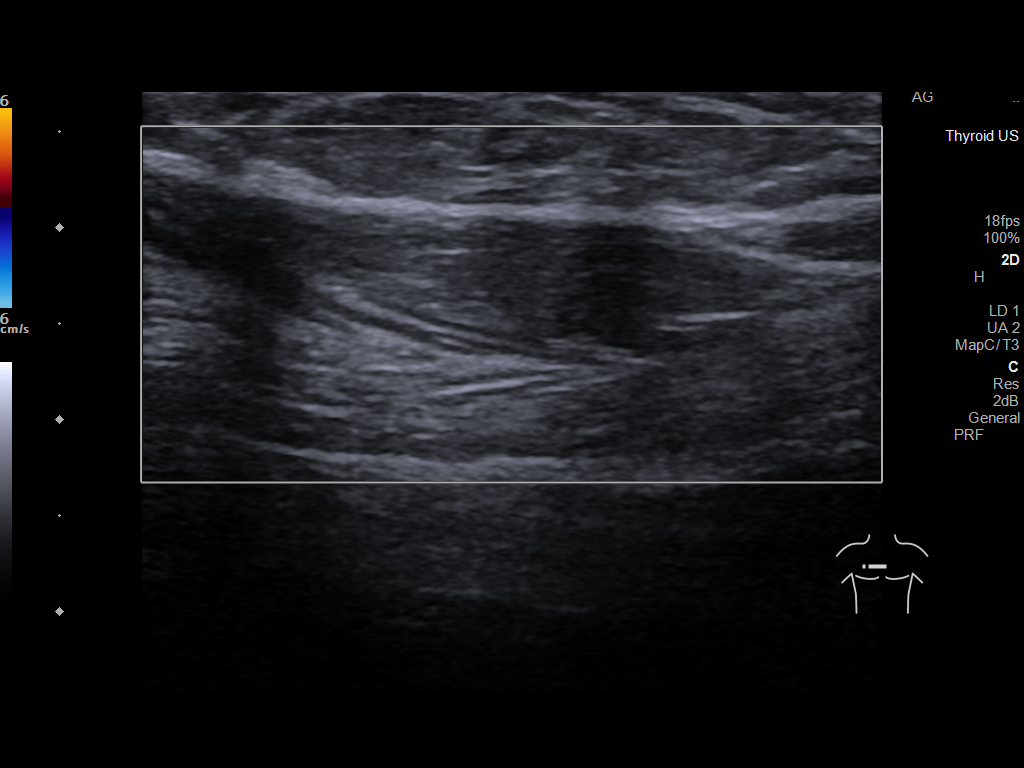
[im 5/6]
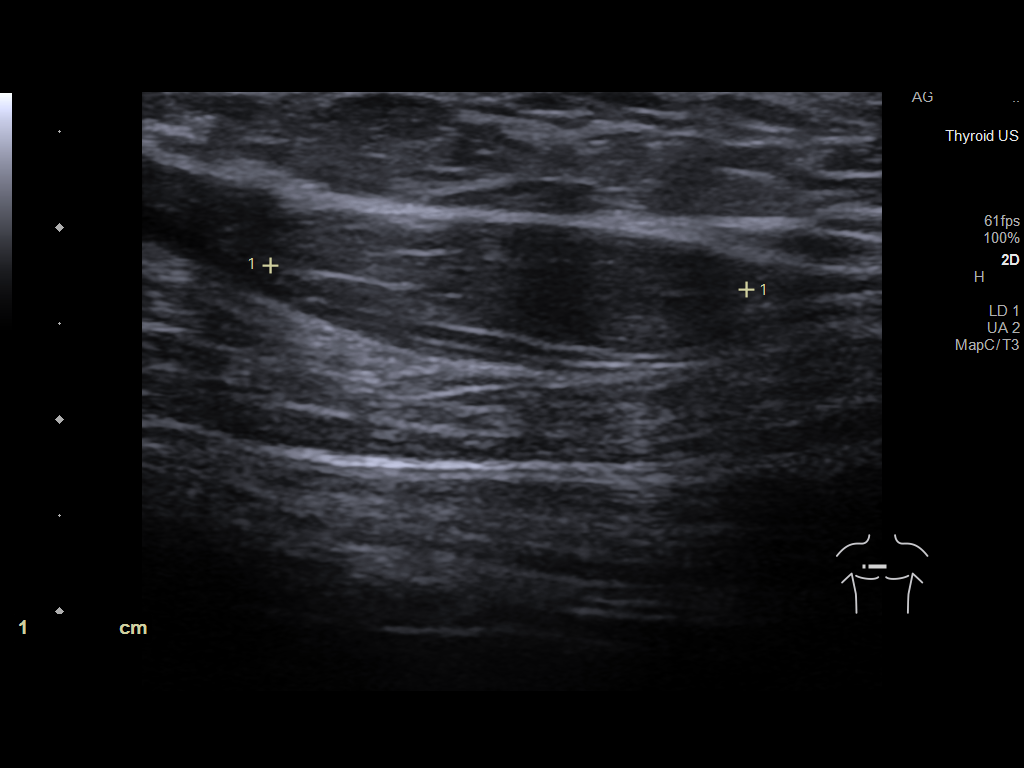
[im 6/6]
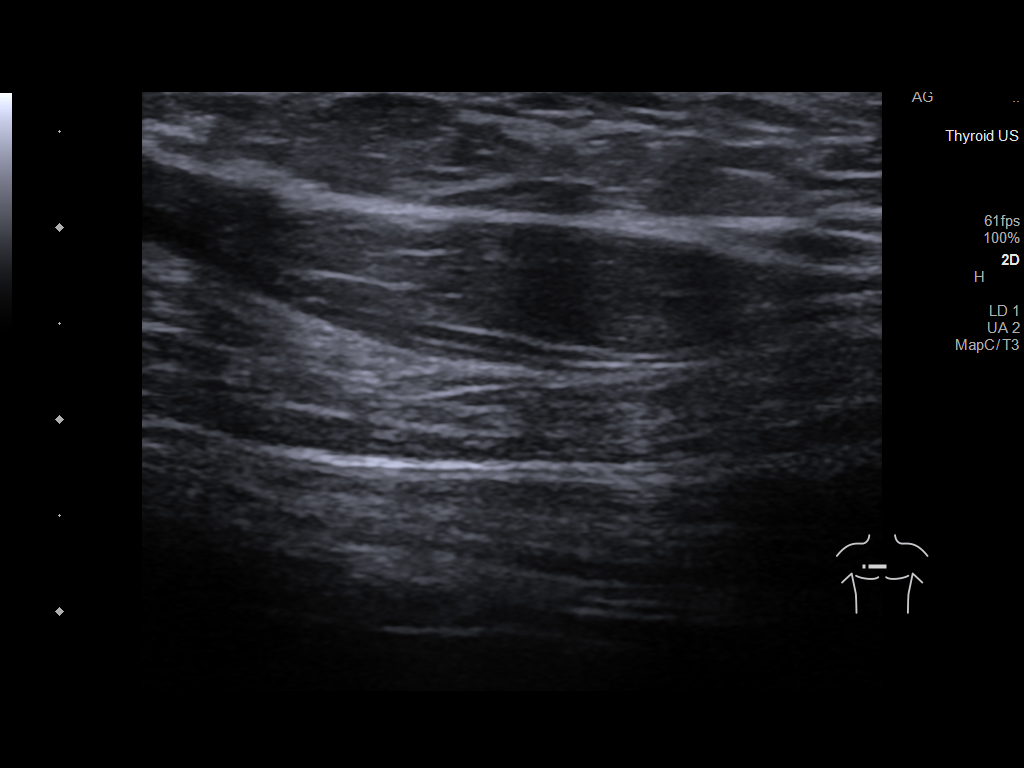

[6 of 6 positions shown; findings below may reference images not displayed]

FINDINGS: Focused sonographic exam of the right posterior neck soft tissues
was performed. Images demonstrate a 2.6 x 2.5 x 0.8 cm lesion that
is isoechoic to the adjacent fat. Its contours do not appear well
encapsulated and blend with the adjacent tissues. It demonstrates
thin internal striations. No appreciable internal flow by Doppler
imaging.
IMPRESSION: There is a 2.6 cm lipoma in the right posterior neck soft tissues.

## 2022-06-10 ENCOUNTER — Encounter: Payer: Self-pay | Admitting: Orthopaedic Surgery

## 2022-06-10 ENCOUNTER — Ambulatory Visit (INDEPENDENT_AMBULATORY_CARE_PROVIDER_SITE_OTHER): Payer: BC Managed Care – PPO

## 2022-06-10 ENCOUNTER — Ambulatory Visit (INDEPENDENT_AMBULATORY_CARE_PROVIDER_SITE_OTHER): Payer: BC Managed Care – PPO | Admitting: Orthopaedic Surgery

## 2022-06-10 VITALS — Ht 72.0 in | Wt 280.0 lb

## 2022-06-10 DIAGNOSIS — M545 Low back pain, unspecified: Secondary | ICD-10-CM

## 2022-06-10 DIAGNOSIS — G5601 Carpal tunnel syndrome, right upper limb: Secondary | ICD-10-CM

## 2022-06-10 DIAGNOSIS — M51369 Other intervertebral disc degeneration, lumbar region without mention of lumbar back pain or lower extremity pain: Secondary | ICD-10-CM | POA: Insufficient documentation

## 2022-06-10 DIAGNOSIS — M5136 Other intervertebral disc degeneration, lumbar region: Secondary | ICD-10-CM

## 2022-06-10 DIAGNOSIS — G8929 Other chronic pain: Secondary | ICD-10-CM

## 2022-06-10 NOTE — Progress Notes (Signed)
Office Visit Note   Patient: Logan Smith           Date of Birth: March 08, 1971           MRN: [id number] Visit Date: 06/10/2022              Requested by: Sharion Balloon, [address] [place],  [place] [address] PCP: Sharion Balloon, FNP   Assessment & Plan: Visit Diagnoses:  1. Chronic midline low back pain without sciatica   2. Carpal tunnel syndrome on right   3. Other intervertebral disc degeneration, lumbar region     Plan: For his carpal tunnel complaints get worse he can return.  Currently no radicular symptoms.  Reviewed x-rays and gave him copies of the lumbar films were printed.  He can use some Aspercreme to rub on his hands or back as needed.  When he takes Advil it sometimes raises his blood pressure.  Follow-Up Instructions: No follow-ups on file.   Orders:  Orders Placed This Encounter  Procedures   XR Lumbar Spine 2-3 Views   No orders of the defined types were placed in this encounter.     Procedures: No procedures performed   Clinical Data: No additional findings.   Subjective: Chief Complaint  Patient presents with   Right Hand - Numbness, Follow-up    EMG/NCS review   Lower Back - Pain    HPI patient returns for follow-up of right carpal tunnel syndrome complaints.  Electrical test shows mild to moderate carpal tunnel.  He has been using night splint states recently his hand has been better does not wake him up as long as he wears the splints.  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  He has had some stiffness in his back low back pain radiates to his buttocks but that not down his legs.  No bowel or bladder symptoms no fever or chills.  X-rays were obtained today results below.  Review of Systems updated unchanged from last office visit.   Objective: Vital Signs: Ht 6' (1.829 m)   Wt 280 lb (127 kg)   BMI 37.97 kg/m   Physical Exam Constitutional:      Appearance: He is well-developed.  HENT:     Head: Normocephalic and  atraumatic.     Right Ear: External ear normal.     Left Ear: External ear normal.  Eyes:     Pupils: Pupils are equal, round, and reactive to light.  Neck:     Thyroid: No thyromegaly.     Trachea: No tracheal deviation.  Cardiovascular:     Rate and Rhythm: Normal rate.  Pulmonary:     Effort: Pulmonary effort is normal.     Breath sounds: No wheezing.  Abdominal:     General: Bowel sounds are normal.     Palpations: Abdomen is soft.  Musculoskeletal:     Cervical back: Neck supple.  Skin:    General: Skin is warm and dry.     Capillary Refill: Capillary refill takes less than 2 seconds.  Neurological:     Mental Status: He is alert and oriented to person, place, and time.  Psychiatric:        Behavior: Behavior normal.        Thought Content: Thought content normal.        Judgment: Judgment normal.     Ortho Exam no thenar atrophy some pain with carpal compression he is able to heel and toe walk.  Negative logroll the hips.  No rash over exposed skin.  Specialty Comments:  No specialty comments available.  Imaging: XR Lumbar Spine 2-3 Views  Result Date: 06/10/2022 [facility] lateral lumbar spine images demonstrate some narrowing at L3-4 and L5-S1.  Some anterior spurring at L3-4.  T11-T12 narrowing and anterior spurring is noted.  Sacroiliac joints and hip joints appear normal. Impression: Mild lumbar degenerative changes L3-4 L5-S1.    PMFS History: Patient Active Problem List   Diagnosis Date Noted   Other intervertebral disc degeneration, lumbar region 06/10/2022   Elevated PSA 04/30/2022   Carpal tunnel syndrome on right 11/10/2018   Morbid obesity ([place]) 01/30/2016   Condyloma acuminatum - perianal & anal canal s/p ablation 12/14/2013 11/28/2013   Hemorrhoids, internal 11/28/2013   Hypertension 01/05/2013   Hyperlipemia 01/05/2013   ED (erectile dysfunction) 01/05/2013   Past Medical History:  Diagnosis Date   Back pain    Erectile dysfunction     Hyperlipidemia    Hypertension     Family History  Problem Relation Age of Onset   Diabetes Mother    Hypertension Mother    Hypertension Father     Past Surgical History:  Procedure Laterality Date   COLONOSCOPY N/A 11/21/2020   Procedure: COLONOSCOPY;  Surgeon: Aviva Signs, MD;  Location: [facility];  Service: Gastroenterology;  Laterality: N/A;   HEMORROIDECTOMY     VASECTOMY     Social History   Occupational History   Not on file  Tobacco Use   Smoking status: Never   Smokeless tobacco: Never  Vaping Use   Vaping Use: Never used  Substance and Sexual Activity   Alcohol use: No    Comment: rarely   Drug use: No   Sexual activity: Not on file

## 2022-07-02 ENCOUNTER — Other Ambulatory Visit: Payer: Self-pay | Admitting: Family

## 2022-07-02 DIAGNOSIS — I10 Essential (primary) hypertension: Secondary | ICD-10-CM

## 2022-07-19 ENCOUNTER — Ambulatory Visit: Payer: BC Managed Care – PPO | Admitting: Family

## 2022-07-20 ENCOUNTER — Encounter: Payer: Self-pay | Admitting: Family

## 2022-07-27 ENCOUNTER — Other Ambulatory Visit: Payer: Self-pay | Admitting: Nurse Practitioner

## 2022-08-13 ENCOUNTER — Ambulatory Visit: Payer: BC Managed Care – PPO | Admitting: Family

## 2022-09-17 ENCOUNTER — Other Ambulatory Visit: Payer: Self-pay | Admitting: Family

## 2022-09-17 DIAGNOSIS — I10 Essential (primary) hypertension: Secondary | ICD-10-CM

## 2022-10-07 ENCOUNTER — Ambulatory Visit: Payer: BC Managed Care – PPO | Admitting: Family

## 2022-10-28 ENCOUNTER — Encounter: Payer: Self-pay | Admitting: Family

## 2022-10-28 ENCOUNTER — Ambulatory Visit (INDEPENDENT_AMBULATORY_CARE_PROVIDER_SITE_OTHER): Payer: BC Managed Care – PPO | Admitting: Family

## 2022-10-28 VITALS — BP 123/78 | HR 80 | Temp 98.5°F | Ht 72.0 in | Wt 287.0 lb

## 2022-10-28 DIAGNOSIS — I10 Essential (primary) hypertension: Secondary | ICD-10-CM

## 2022-10-28 DIAGNOSIS — E782 Mixed hyperlipidemia: Secondary | ICD-10-CM

## 2022-10-28 DIAGNOSIS — Z23 Encounter for immunization: Secondary | ICD-10-CM | POA: Diagnosis not present

## 2022-10-28 DIAGNOSIS — E049 Nontoxic goiter, unspecified: Secondary | ICD-10-CM

## 2022-10-28 NOTE — Progress Notes (Signed)
Subjective:    Patient ID: Logan Smith, male    DOB: May 24, 1971, 52 y.o.   MRN: [id number]  Chief Complaint  Patient presents with   Medical Management of Chronic Issues   PT presents to the office today for chronic follow up. He is considered morbid obese with a BMI of 38 and comorbidity of HTN and hyperlipidemia.     He was taking phentermine and states he lost two pounds. He reports he has not taken in 3 weeks. Would like to try to restart this.   He had an elevated PSA and had negative biopsy for malignancy. He is followed by Urologists every 6 months.   Hypertension This is a chronic problem. The current episode started more than 1 year ago. The problem has been resolved since onset. The problem is controlled. Pertinent negatives include no malaise/fatigue, peripheral edema or shortness of breath. Risk factors for coronary artery disease include dyslipidemia, obesity and male gender. The current treatment provides moderate improvement.  Hyperlipidemia This is a chronic problem. The current episode started more than 1 year ago. The problem is controlled. Recent lipid tests were reviewed and are normal. Exacerbating diseases include obesity. Pertinent negatives include no shortness of breath. Current antihyperlipidemic treatment includes statins. The current treatment provides moderate improvement of lipids. Risk factors for coronary artery disease include dyslipidemia, hypertension, male sex and a sedentary lifestyle.  Back Pain This is a chronic problem. The current episode started more than 1 year ago. The problem occurs intermittently. The problem has been waxing and waning since onset. The pain is present in the lumbar spine. The pain is at a severity of 6/10. The pain is moderate. He has tried home exercises for the symptoms. The treatment provided mild relief.      Review of Systems  Constitutional:  Negative for malaise/fatigue.  Respiratory:  Negative for shortness of  breath.   Musculoskeletal:  Positive for back pain.  All other systems reviewed and are negative.      Objective:   Physical Exam Vitals reviewed.  Constitutional:      General: He is not in acute distress.    Appearance: He is well-developed.  HENT:     Head: Normocephalic.     Right Ear: Tympanic membrane normal.     Left Ear: Tympanic membrane normal.  Eyes:     General:        Right eye: No discharge.        Left eye: No discharge.     Pupils: Pupils are equal, round, and reactive to light.  Neck:     Thyroid: No thyromegaly.     Comments: Goiter, nodule like on left thyroid Cardiovascular:     Rate and Rhythm: Normal rate and regular rhythm.     Heart sounds: Normal heart sounds. No murmur heard. Pulmonary:     Effort: Pulmonary effort is normal. No respiratory distress.     Breath sounds: Normal breath sounds. No wheezing.  Abdominal:     General: Bowel sounds are normal. There is no distension.     Palpations: Abdomen is soft.     Tenderness: There is no abdominal tenderness.  Musculoskeletal:        General: No tenderness. Normal range of motion.     Cervical back: Normal range of motion and neck supple.  Skin:    General: Skin is warm and dry.     Findings: No erythema or rash.  Neurological:     Mental Status:  He is alert and oriented to person, place, and time.     Cranial Nerves: No cranial nerve deficit.     Deep Tendon Reflexes: Reflexes are normal and symmetric.  Psychiatric:        Behavior: Behavior normal.        Thought Content: Thought content normal.        Judgment: Judgment normal.       BP 123/78   Pulse 80   Temp 98.5 F (36.9 C) (Temporal)   Ht 6' (1.829 m)   Wt 287 lb (130.2 kg)   SpO2 93%   BMI 38.92 kg/m      Assessment & Plan:  Madeline Bebout comes in today with chief complaint of Medical Management of Chronic Issues   Diagnosis and orders addressed:  1. Primary hypertension - CMP14+EGFR  2. Mixed hyperlipidemia -  CMP14+EGFR  3. Morbid obesity - CMP14+EGFR  4. Goiter - US THYROID; Future - CMP14+EGFR   Labs pending Health Maintenance reviewed Diet and exercise encouraged  Follow up plan: 6 months   Jannifer Rodney, FNP

## 2022-10-28 NOTE — Patient Instructions (Signed)
Goiter  A goiter is an enlarged thyroid gland. The thyroid gland is located in the lower front part of the neck, just in front of the windpipe (trachea). This gland makes hormones that affect how the body processes food for energy (metabolism) and how the heart and brain function. Most goiters are painless and are not a cause for concern. Some goiters can affect the way your thyroid makes thyroid hormones. Goiters and conditions that cause goiters can be treated, if necessary. What are the causes? This condition may be caused by: Lack of a mineral called iodine. The thyroid gland uses iodine to make thyroid hormones. Diseases that attack healthy cells in the body (autoimmune diseases) and affect thyroid function, such as Graves' disease or Hashimoto's disease. These diseases may cause the body to produce too much thyroid hormone (hyperthyroidism) or too little of the hormone (hypothyroidism). Conditions that cause inflammation of the thyroid (thyroiditis). One or more small growths on the thyroid (nodular goiter). Other causes may include: Medical problems caused by abnormal genes that are passed from parent to child (genetic defects). Thyroid injury or infection. Tumors that may or may not be cancerous. Pregnancy. Certain medicines. Exposure to radiation. In some cases, the cause may not be known. What increases the risk? The following factors may make you more likely to develop this condition: You do not get enough iodine in your diet. You have a family history of goiter. You are male. You are older than age 40. You smoke tobacco. You have had exposure to radiation. What are the signs or symptoms? The main symptom of this condition is swelling in the lower, front part of the neck. This swelling can range from a very small bump to a large lump. Other symptoms may include: A tight feeling in the throat. A hoarse voice. Coughing. Wheezing. Difficulty swallowing or breathing. Bulging  veins in the neck. Dizziness. When a goiter is the result of an overactive thyroid (hyperthyroidism), symptoms may also include: Nervousness or restlessness. Inability to tolerate heat. Unexplained weight loss. Diarrhea. Changes in heartbeat, such as skipped beats, extra beats, or a rapid heart rate. Loss of menstruation. Increased appetite. Sleep problems. When a goiter is the result of an underactive thyroid (hypothyroidism), symptoms may also include: Feeling tired (fatigue). Inability to tolerate cold. Weight gain that is not explained by a change in diet or exercise habits. Dry skin or coarse hair. Irregular menstrual periods. Constipation. Sadness or depression. In some cases, there may not be any symptoms. How is this diagnosed? This condition may be diagnosed based on your symptoms, your medical history, and a physical exam. You may have tests, such as: Blood tests to check thyroid function. Imaging tests, such as: Ultrasound. CT scan. MRI. Thyroid scan. Removal of a tissue sample (biopsy) of the goiter or any nodules. The sample will be tested to check for cancer. How is this treated? Treatment for this condition depends on the cause and your symptoms. Treatment may include: Medicines to regulate thyroid hormone levels. Anti-inflammatory medicines or steroid medicines, if the goiter is caused by inflammation. Iodine supplements or changes to your diet, if the goiter is caused by iodine deficiency. Radioactive iodine treatment. Surgery to remove your thyroid. In some cases, you may only need regular check-ups with your health care provider to monitor your condition, and you may not need treatment. Follow these instructions at home: Follow instructions from your health care provider about any changes to your diet. Take over-the-counter and prescription medicines only as told   by your health care provider. These include supplements. Do not use any products that contain  nicotine or tobacco. These products include cigarettes, chewing tobacco, and vaping devices, such as e-cigarettes. If you need help quitting, ask your health care provider. Keep all follow-up visits. Your health care provider will want to repeat blood tests to check thyroid function. Where to find more information American Thyroid Association: thyroid.org Endocrine Society: endocrine.org Contact a health care provider if: Your symptoms do not get better with treatment. You have nausea, vomiting, or diarrhea. You have a fever. You suddenly become very weak. You experience extreme restlessness. Get help right away if: You have sudden, unexplained confusion or other mental changes. You have chest pain. You have trouble breathing or swallowing. You have fast or irregular heartbeats (palpitations). These symptoms may be an emergency. Get help right away. Call 911. Do not wait to see if the symptoms will go away. Do not drive yourself to the hospital. Summary A goiter is an enlarged thyroid gland. The thyroid gland is located in the lower front part of the neck, just in front of the windpipe. The main symptom of this condition is swelling in the lower, front part of the neck. This swelling can range from a very small bump to a large lump. Treatment for this condition depends on the cause and your symptoms. You may need medicines, supplements, or regular monitoring of your condition. This information is not intended to replace advice given to you by your health care provider. Make sure you discuss any questions you have with your health care provider. Document Revised: 08/21/2021 Document Reviewed: 08/21/2021 Elsevier Patient Education  2023 Elsevier Inc.  

## 2022-10-29 LAB — CMP14+EGFR
ALT: 24 IU/L (ref 0–44)
AST: 18 IU/L (ref 0–40)
Albumin/Globulin Ratio: 1.6 (ref 1.2–2.2)
Albumin: 4.4 g/dL (ref 4.1–5.1)
Alkaline Phosphatase: 89 IU/L (ref 44–121)
BUN/Creatinine Ratio: 15 (ref 9–20)
BUN: 13 mg/dL (ref 6–24)
Bilirubin Total: 0.5 mg/dL (ref 0.0–1.2)
CO2: 24 mmol/L (ref 20–29)
Calcium: 9.8 mg/dL (ref 8.7–10.2)
Chloride: 103 mmol/L (ref 96–106)
Creatinine, Ser: 0.89 mg/dL (ref 0.76–1.27)
Globulin, Total: 2.8 g/dL (ref 1.5–4.5)
Glucose: 98 mg/dL (ref 70–99)
Potassium: 4.2 mmol/L (ref 3.5–5.2)
Sodium: 142 mmol/L (ref 134–144)
Total Protein: 7.2 g/dL (ref 6.0–8.5)
eGFR: 104 mL/min/{1.73_m2} (ref >59)

## 2022-11-11 ENCOUNTER — Ambulatory Visit (HOSPITAL_COMMUNITY)
Admission: RE | Admit: 2022-11-11 | Discharge: 2022-11-11 | Disposition: A | Discharge status: Home / Self Care | Payer: BC Managed Care – PPO | Source: Ambulatory Visit | Attending: Family | Admitting: Family

## 2022-11-11 DIAGNOSIS — E049 Nontoxic goiter, unspecified: Secondary | ICD-10-CM | POA: Diagnosis not present

## 2022-12-04 ENCOUNTER — Other Ambulatory Visit: Payer: Self-pay | Admitting: Family

## 2022-12-04 DIAGNOSIS — I10 Essential (primary) hypertension: Secondary | ICD-10-CM

## 2022-12-19 ENCOUNTER — Other Ambulatory Visit: Payer: Self-pay | Admitting: Family

## 2022-12-19 DIAGNOSIS — I10 Essential (primary) hypertension: Secondary | ICD-10-CM

## 2023-03-15 ENCOUNTER — Ambulatory Visit: Payer: BC Managed Care – PPO | Admitting: Family

## 2023-03-16 ENCOUNTER — Other Ambulatory Visit: Payer: Self-pay | Admitting: Nurse Practitioner

## 2023-03-16 DIAGNOSIS — J301 Allergic rhinitis due to pollen: Secondary | ICD-10-CM

## 2023-04-26 ENCOUNTER — Ambulatory Visit: Payer: BC Managed Care – PPO | Admitting: Family

## 2023-05-12 ENCOUNTER — Ambulatory Visit: Payer: BC Managed Care – PPO | Admitting: Family

## 2023-06-02 ENCOUNTER — Other Ambulatory Visit: Payer: Self-pay | Admitting: Family

## 2023-06-02 DIAGNOSIS — I10 Essential (primary) hypertension: Secondary | ICD-10-CM

## 2023-06-02 DIAGNOSIS — E782 Mixed hyperlipidemia: Secondary | ICD-10-CM

## 2023-06-24 ENCOUNTER — Encounter: Payer: Self-pay | Admitting: Family

## 2023-06-24 ENCOUNTER — Telehealth (INDEPENDENT_AMBULATORY_CARE_PROVIDER_SITE_OTHER): Payer: BC Managed Care – PPO | Admitting: Family

## 2023-06-24 DIAGNOSIS — Z91199 Patient's noncompliance with other medical treatment and regimen due to unspecified reason: Secondary | ICD-10-CM

## 2023-06-24 NOTE — Progress Notes (Signed)
Pt can not connect and wants to reschedule.   Jannifer Rodney, FNP

## 2023-06-24 NOTE — Progress Notes (Deleted)
Virtual Visit Consent   [name], you are scheduled for a virtual visit with a [facility] provider today. Just as with appointments in the office, your consent must be obtained to participate. Your consent will be active for this visit and any virtual visit you may have with one of our providers in the next 365 days. If you have a MyChart account, a copy of this consent can be sent to you electronically.  As this is a virtual visit, video technology does not allow for your provider to perform a traditional examination. This may limit your provider's ability to fully assess your condition. If your provider identifies any concerns that need to be evaluated in person or the need to arrange testing (such as labs, EKG, etc.), we will make arrangements to do so. Although advances in technology are sophisticated, we cannot ensure that it will always work on either your end or our end. If the connection with a video visit is poor, the visit may have to be switched to a telephone visit. With either a video or telephone visit, we are not always able to ensure that we have a secure connection.  By engaging in this virtual visit, you consent to the provision of healthcare and authorize for your insurance to be billed (if applicable) for the services provided during this visit. Depending on your insurance coverage, you may receive a charge related to this service.  I need to obtain your verbal consent now. Are you willing to proceed with your visit today? Jerian Purdue has provided verbal consent on 06/24/2023 for a virtual visit (video or telephone). Jannifer Rodney, FNP  Date: 06/24/2023 9:24 AM  Virtual Visit via Video Note   I, Jannifer Rodney, connected with  Logan Smith  ([id number], 08/15/1970) on 06/24/23 at  9:25 AM EST by a video-enabled telemedicine application and verified that I am speaking with the correct person using two identifiers.  Location: Patient: {Virtual Visit Location  Patient:25492::"Home"} Provider: Virtual Visit Location Provider: Home Office   I discussed the limitations of evaluation and management by telemedicine and the availability of in person appointments. The patient expressed understanding and agreed to proceed.    History of Present Illness: Logan Smith is a 52 y.o. who identifies as a male who was assigned male at birth, and is being seen today for ***.  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 12/14/2013 11/28/2013   Hemorrhoids, internal 11/28/2013   Hypertension 01/05/2013   Hyperlipemia 01/05/2013   ED (erectile dysfunction) 01/05/2013    Allergies: No Known Allergies Medications:  Current Outpatient Medications:    amLODipine (NORVASC) 5 MG tablet, TAKE 1 TABLET (5 MG TOTAL) BY MOUTH DAILY., Disp: 90 tablet, Rfl: 1   benazepril-hydrochlorthiazide (LOTENSIN HCT) 10-12.5 MG tablet, TAKE 1 TABLET BY MOUTH EVERY DAY, Disp: 90 tablet, Rfl: 0   cetirizine (ZYRTEC) 10 MG tablet, TAKE 1 TABLET BY MOUTH EVERY DAY, Disp: 90 tablet, Rfl: 1   fish oil-omega-3 fatty acids 1000 MG capsule, Take 2 g by mouth daily., Disp: , Rfl:    hydrocortisone (ANUSOL-HC) 25 MG suppository, Place 1 suppository (25 mg total) rectally 2 (two) times daily. (Patient taking differently: Place 25 mg rectally 2 (two) times daily as needed  for hemorrhoids.), Disp: 12 suppository, Rfl: 0   Multiple Vitamins-Minerals (MENS 50+ MULTI VITAMIN/MIN PO), Take 1 tablet by mouth daily., Disp: , Rfl:    naproxen (NAPROSYN) 500 MG tablet, Take 1 tablet (500 mg total) by mouth 2 (two) times daily with a meal., Disp: 60 tablet, Rfl: 1   pramoxine-hydrocortisone (PROCTOCREAM-HC) 1-1 % rectal cream, Place 1 application  rectally 2 (two) times daily. (Patient taking differently: Place 1 application  rectally 2 (two) times daily as needed for hemorrhoids.), Disp: 30 g, Rfl: 0   rosuvastatin (CRESTOR) 10 MG tablet, TAKE 1 TABLET BY MOUTH EVERY DAY, Disp: 90 tablet, Rfl: 0   sildenafil (VIAGRA) 25 MG tablet, Take 1 tablet (25 mg total) by mouth daily as needed for erectile dysfunction., Disp: 10 tablet, Rfl: 0  Observations/Objective: Patient is well-developed, well-nourished in no acute distress.  Resting comfortably *** at home.  Head is normocephalic, atraumatic.  No labored breathing. *** Speech is clear and coherent with logical content.  Patient is alert and oriented at baseline.  ***  Assessment and Plan: There are no diagnoses linked to this encounter. ***  Follow Up Instructions: I discussed the assessment and treatment plan with the patient. The patient was provided an opportunity to ask questions and all were answered. The patient agreed with the plan and demonstrated an understanding of the instructions.  A copy of instructions were sent to the patient via MyChart unless otherwise noted below.   {EMAIL AVS:[id number]::"Patient has requested to receive PHI (AVS, Work Notes, etc) pertaining to this video visit through e-mail as they are currently without active MyChart. They have voiced understand that email is not considered secure and their health information could be viewed by someone other than the patient. "}  The patient was advised to call back or seek an in-person evaluation if the symptoms worsen or if the condition fails to improve as anticipated.    Jannifer Rodney, FNP

## 2023-07-25 DIAGNOSIS — Z114 Encounter for screening for human immunodeficiency virus [HIV]: Secondary | ICD-10-CM | POA: Diagnosis not present

## 2023-07-25 DIAGNOSIS — Z113 Encounter for screening for infections with a predominantly sexual mode of transmission: Secondary | ICD-10-CM | POA: Diagnosis not present

## 2023-08-26 ENCOUNTER — Other Ambulatory Visit: Payer: Self-pay | Admitting: Family

## 2023-08-26 DIAGNOSIS — I10 Essential (primary) hypertension: Secondary | ICD-10-CM

## 2023-09-24 ENCOUNTER — Other Ambulatory Visit: Payer: Self-pay | Admitting: Family

## 2023-09-24 DIAGNOSIS — I10 Essential (primary) hypertension: Secondary | ICD-10-CM

## 2023-09-26 NOTE — Telephone Encounter (Signed)
 Refill denied, pt was given 30 day supply 08/26/2023. Please schedule pt appt with PCP for further refills.

## 2023-09-26 NOTE — Telephone Encounter (Signed)
 Apt scheduled for 09/27/2023

## 2023-09-27 ENCOUNTER — Ambulatory Visit: Admitting: Nurse Practitioner

## 2023-10-11 ENCOUNTER — Encounter: Payer: Self-pay | Admitting: Family

## 2023-10-11 ENCOUNTER — Ambulatory Visit: Admitting: Family

## 2023-10-21 ENCOUNTER — Other Ambulatory Visit: Payer: Self-pay | Admitting: Family

## 2023-10-21 DIAGNOSIS — I10 Essential (primary) hypertension: Secondary | ICD-10-CM

## 2023-10-27 ENCOUNTER — Ambulatory Visit: Admitting: Family

## 2023-10-31 ENCOUNTER — Encounter: Payer: Self-pay | Admitting: Family

## 2023-11-16 ENCOUNTER — Other Ambulatory Visit: Payer: Self-pay | Admitting: Family

## 2023-11-16 DIAGNOSIS — I10 Essential (primary) hypertension: Secondary | ICD-10-CM

## 2023-11-17 DIAGNOSIS — L700 Acne vulgaris: Secondary | ICD-10-CM | POA: Diagnosis not present

## 2023-11-17 DIAGNOSIS — D485 Neoplasm of uncertain behavior of skin: Secondary | ICD-10-CM | POA: Diagnosis not present

## 2023-11-21 ENCOUNTER — Encounter (HOSPITAL_COMMUNITY): Payer: Self-pay

## 2023-11-27 ENCOUNTER — Other Ambulatory Visit: Payer: Self-pay | Admitting: Family

## 2023-11-27 DIAGNOSIS — E782 Mixed hyperlipidemia: Secondary | ICD-10-CM

## 2023-11-28 ENCOUNTER — Ambulatory Visit: Admitting: Family

## 2023-11-30 ENCOUNTER — Other Ambulatory Visit: Payer: Self-pay | Admitting: Family

## 2023-11-30 DIAGNOSIS — E782 Mixed hyperlipidemia: Secondary | ICD-10-CM

## 2023-12-08 ENCOUNTER — Ambulatory Visit: Admitting: Family Medicine

## 2023-12-22 ENCOUNTER — Other Ambulatory Visit: Payer: Self-pay | Admitting: Family

## 2023-12-22 DIAGNOSIS — I10 Essential (primary) hypertension: Secondary | ICD-10-CM

## 2023-12-24 ENCOUNTER — Other Ambulatory Visit: Payer: Self-pay | Admitting: Family

## 2023-12-24 DIAGNOSIS — I10 Essential (primary) hypertension: Secondary | ICD-10-CM

## 2024-01-09 ENCOUNTER — Ambulatory Visit: Admitting: Family

## 2024-01-09 DIAGNOSIS — R208 Other disturbances of skin sensation: Secondary | ICD-10-CM | POA: Diagnosis not present

## 2024-01-09 DIAGNOSIS — D485 Neoplasm of uncertain behavior of skin: Secondary | ICD-10-CM | POA: Diagnosis not present

## 2024-01-09 DIAGNOSIS — B078 Other viral warts: Secondary | ICD-10-CM | POA: Diagnosis not present

## 2024-01-09 DIAGNOSIS — L700 Acne vulgaris: Secondary | ICD-10-CM | POA: Diagnosis not present

## 2024-01-09 DIAGNOSIS — L538 Other specified erythematous conditions: Secondary | ICD-10-CM | POA: Diagnosis not present

## 2024-01-23 DIAGNOSIS — B078 Other viral warts: Secondary | ICD-10-CM | POA: Diagnosis not present

## 2024-01-23 DIAGNOSIS — L538 Other specified erythematous conditions: Secondary | ICD-10-CM | POA: Diagnosis not present

## 2024-01-23 DIAGNOSIS — L700 Acne vulgaris: Secondary | ICD-10-CM | POA: Diagnosis not present

## 2024-01-31 ENCOUNTER — Other Ambulatory Visit: Payer: Self-pay | Admitting: Family

## 2024-01-31 DIAGNOSIS — I10 Essential (primary) hypertension: Secondary | ICD-10-CM

## 2024-02-27 ENCOUNTER — Ambulatory Visit: Admitting: Family

## 2024-03-11 ENCOUNTER — Other Ambulatory Visit: Payer: Self-pay | Admitting: Family

## 2024-03-11 DIAGNOSIS — E782 Mixed hyperlipidemia: Secondary | ICD-10-CM

## 2024-03-16 ENCOUNTER — Ambulatory Visit (INDEPENDENT_AMBULATORY_CARE_PROVIDER_SITE_OTHER): Admitting: Family

## 2024-03-16 ENCOUNTER — Encounter: Payer: Self-pay | Admitting: Family

## 2024-03-16 VITALS — BP 125/79 | HR 86 | Temp 97.8°F | Ht 72.0 in | Wt 291.2 lb

## 2024-03-16 DIAGNOSIS — Z23 Encounter for immunization: Secondary | ICD-10-CM | POA: Diagnosis not present

## 2024-03-16 DIAGNOSIS — R972 Elevated prostate specific antigen [PSA]: Secondary | ICD-10-CM

## 2024-03-16 DIAGNOSIS — Z0000 Encounter for general adult medical examination without abnormal findings: Secondary | ICD-10-CM | POA: Diagnosis not present

## 2024-03-16 DIAGNOSIS — I10 Essential (primary) hypertension: Secondary | ICD-10-CM

## 2024-03-16 DIAGNOSIS — Z0001 Encounter for general adult medical examination with abnormal findings: Secondary | ICD-10-CM

## 2024-03-16 DIAGNOSIS — J301 Allergic rhinitis due to pollen: Secondary | ICD-10-CM

## 2024-03-16 DIAGNOSIS — M545 Low back pain, unspecified: Secondary | ICD-10-CM

## 2024-03-16 DIAGNOSIS — N529 Male erectile dysfunction, unspecified: Secondary | ICD-10-CM | POA: Diagnosis not present

## 2024-03-16 DIAGNOSIS — E782 Mixed hyperlipidemia: Secondary | ICD-10-CM | POA: Diagnosis not present

## 2024-03-16 DIAGNOSIS — G8929 Other chronic pain: Secondary | ICD-10-CM

## 2024-03-16 LAB — LIPID PANEL

## 2024-03-16 MED ORDER — AMLODIPINE BESYLATE 5 MG PO TABS: 5.0000 mg | ORAL_TABLET | Freq: Every day | ORAL | 1 refills | Status: DC

## 2024-03-16 MED ORDER — CETIRIZINE HCL 10 MG PO TABS: 10.0000 mg | ORAL_TABLET | Freq: Every day | ORAL | 1 refills | Status: AC

## 2024-03-16 MED ORDER — SILDENAFIL CITRATE 25 MG PO TABS: 25.0000 mg | ORAL_TABLET | Freq: Every day | ORAL | 0 refills | Status: AC | PRN

## 2024-03-16 MED ORDER — BENAZEPRIL-HYDROCHLOROTHIAZIDE 10-12.5 MG PO TABS: 1.0000 | ORAL_TABLET | Freq: Every day | ORAL | 0 refills | Status: DC

## 2024-03-16 MED ORDER — ROSUVASTATIN CALCIUM 10 MG PO TABS: 10.0000 mg | ORAL_TABLET | Freq: Every day | ORAL | 1 refills | Status: AC

## 2024-03-16 NOTE — Progress Notes (Signed)
 Subjective:    Patient ID: Logan Smith, male    DOB: Mar 17, 1971, 53 y.o.   MRN: [id number]  Chief Complaint  Patient presents with   Annual Exam   PT presents to the office today for CPE and  chronic follow up.   He is  morbid obese with a BMI of 39 and comorbidity of HTN and hyperlipidemia.     He was taking phentermine  and states he lost two pounds. He reports he has not taken in 3 weeks. Would like to try to restart this.   He had an elevated PSA and had negative biopsy for malignancy. He is followed by Urologists every 6 months.   Hypertension This is a chronic problem. The current episode started more than 1 year ago. The problem has been resolved since onset. The problem is controlled. Pertinent negatives include no malaise/fatigue, peripheral edema or shortness of breath. Risk factors for coronary artery disease include dyslipidemia, obesity and male gender. The current treatment provides moderate improvement.  Hyperlipidemia This is a chronic problem. The current episode started more than 1 year ago. The problem is controlled. Recent lipid tests were reviewed and are normal. Exacerbating diseases include obesity. Pertinent negatives include no shortness of breath. Current antihyperlipidemic treatment includes statins. The current treatment provides moderate improvement of lipids. Risk factors for coronary artery disease include dyslipidemia, hypertension, male sex, a sedentary lifestyle and obesity.  Back Pain This is a chronic problem. The current episode started more than 1 year ago. The problem occurs intermittently. The problem has been waxing and waning since onset. The pain is present in the lumbar spine. The pain is at a severity of 2/10. The pain is mild. The symptoms are aggravated by bending and twisting. He has tried home exercises for the symptoms. The treatment provided mild relief.      Review of Systems  Constitutional:  Negative for malaise/fatigue.   Respiratory:  Negative for shortness of breath.   Musculoskeletal:  Positive for back pain.  All other systems reviewed and are negative.  Family History  Problem Relation Age of Onset   Diabetes Mother    Hypertension Mother    Hypertension Father    Social History   Socioeconomic History   Marital status: Single    Spouse name: Not on file   Number of children: Not on file   Years of education: Not on file   Highest education level: Not on file  Occupational History   Not on file  Tobacco Use   Smoking status: Never   Smokeless tobacco: Never  Vaping Use   Vaping status: Never Used  Substance and Sexual Activity   Alcohol use: No    Comment: rarely   Drug use: No   Sexual activity: Not on file  Other Topics Concern   Not on file  Social History Narrative   Not on file   Social Drivers of Health   Financial Resource Strain: Not on file  Food Insecurity: Not on file  Transportation Needs: Not on file  Physical Activity: Not on file  Stress: Not on file  Social Connections: Not on file       Objective:   Physical Exam Vitals reviewed.  Constitutional:      General: He is not in acute distress.    Appearance: He is well-developed.  HENT:     Head: Normocephalic.     Right Ear: Tympanic membrane normal.     Left Ear: Tympanic membrane normal.  Eyes:  General:        Right eye: No discharge.        Left eye: No discharge.     Pupils: Pupils are equal, round, and reactive to light.  Neck:     Thyroid : No thyromegaly.     Comments: Goiter, nodule like on left thyroid  Cardiovascular:     Rate and Rhythm: Normal rate and regular rhythm.     Heart sounds: Normal heart sounds. No murmur heard. Pulmonary:     Effort: Pulmonary effort is normal. No respiratory distress.     Breath sounds: Normal breath sounds. No wheezing.  Abdominal:     General: Bowel sounds are normal. There is no distension.     Palpations: Abdomen is soft.     Tenderness: There  is no abdominal tenderness.  Musculoskeletal:        General: No tenderness. Normal range of motion.     Cervical back: Normal range of motion and neck supple.  Skin:    General: Skin is warm and dry.     Findings: No erythema or rash.  Neurological:     Mental Status: He is alert and oriented to person, place, and time.     Cranial Nerves: No cranial nerve deficit.     Deep Tendon Reflexes: Reflexes are normal and symmetric.  Psychiatric:        Behavior: Behavior normal.        Thought Content: Thought content normal.        Judgment: Judgment normal.       BP 125/79   Pulse 86   Temp 97.8 F (36.6 C)   Ht 6' (1.829 m)   Wt 291 lb 3.2 oz (132.1 kg)   BMI 39.49 kg/m      Assessment & Plan:  Logan Smith comes in today with chief complaint of Annual Exam   Diagnosis and orders addressed:  1. Primary hypertension - amLODipine  (NORVASC ) 5 MG tablet; Take 1 tablet (5 mg total) by mouth daily.  Dispense: 90 tablet; Refill: 1 - benazepril -hydrochlorthiazide (LOTENSIN  HCT) 10-12.5 MG tablet; Take 1 tablet by mouth daily.  Dispense: 90 tablet; Refill: 0 - CMP14+EGFR - CBC with Differential/Platelet  2. Seasonal allergic rhinitis due to pollen - cetirizine  (ZYRTEC ) 10 MG tablet; Take 1 tablet (10 mg total) by mouth daily.  Dispense: 90 tablet; Refill: 1 - CMP14+EGFR - CBC with Differential/Platelet  3. Mixed hyperlipidemia - rosuvastatin  (CRESTOR ) 10 MG tablet; Take 1 tablet (10 mg total) by mouth daily.  Dispense: 90 tablet; Refill: 1 - CMP14+EGFR - CBC with Differential/Platelet - Lipid panel - TSH  4. Erectile dysfunction, unspecified erectile dysfunction type - sildenafil  (VIAGRA ) 25 MG tablet; Take 1 tablet (25 mg total) by mouth daily as needed for erectile dysfunction.  Dispense: 10 tablet; Refill: 0 - CMP14+EGFR - CBC with Differential/Platelet  5. Immunization due - Pneumococcal conjugate vaccine 20-valent (Prevnar 20) - CMP14+EGFR - CBC with  Differential/Platelet  6. Annual physical exam (Primary) - CMP14+EGFR - CBC with Differential/Platelet - Lipid panel - PSA, total and free - TSH  7. Morbid obesity (HCC)  - CMP14+EGFR - CBC with Differential/Platelet  8. Elevated PSA - CMP14+EGFR - CBC with Differential/Platelet  9. Encounter for immunization - Flu vaccine trivalent PF, 6mos and older(Flulaval,Afluria,Fluarix,Fluzone) - CMP14+EGFR - CBC with Differential/Platelet  10. Chronic bilateral low back pain without sciatica - CMP14+EGFR - CBC with Differential/Platelet   Labs pending Continue current medications  Health Maintenance reviewed Diet and exercise encouraged  Follow up plan: 6 months   Bari Learn, FNP

## 2024-03-16 NOTE — Patient Instructions (Signed)
 Health Maintenance, Male  Adopting a healthy lifestyle and getting preventive care are important in promoting health and wellness. Ask your health care provider about:  The right schedule for you to have regular tests and exams.  Things you can do on your own to prevent diseases and keep yourself healthy.  What should I know about diet, weight, and exercise?  Eat a healthy diet    Eat a diet that includes plenty of vegetables, fruits, low-fat dairy products, and lean protein.  Do not eat a lot of foods that are high in solid fats, added sugars, or sodium.  Maintain a healthy weight  Body mass index (BMI) is a measurement that can be used to identify possible weight problems. It estimates body fat based on height and weight. Your health care provider can help determine your BMI and help you achieve or maintain a healthy weight.  Get regular exercise  Get regular exercise. This is one of the most important things you can do for your health. Most adults should:  Exercise for at least 150 minutes each week. The exercise should increase your heart rate and make you sweat (moderate-intensity exercise).  Do strengthening exercises at least twice a week. This is in addition to the moderate-intensity exercise.  Spend less time sitting. Even light physical activity can be beneficial.  Watch cholesterol and blood lipids  Have your blood tested for lipids and cholesterol at 52 years of age, then have this test every 5 years.  You may need to have your cholesterol levels checked more often if:  Your lipid or cholesterol levels are high.  You are older than 52 years of age.  You are at high risk for heart disease.  What should I know about cancer screening?  Many types of cancers can be detected early and may often be prevented. Depending on your health history and family history, you may need to have cancer screening at various ages. This may include screening for:  Colorectal cancer.  Prostate cancer.  Skin cancer.  Lung  cancer.  What should I know about heart disease, diabetes, and high blood pressure?  Blood pressure and heart disease  High blood pressure causes heart disease and increases the risk of stroke. This is more likely to develop in people who have high blood pressure readings or are overweight.  Talk with your health care provider about your target blood pressure readings.  Have your blood pressure checked:  Every 3-5 years if you are 52-52 years of age.  Every year if you are 3 years old or older.  If you are between the ages of 52 and 72 and are a current or former smoker, ask your health care provider if you should have a one-time screening for abdominal aortic aneurysm (AAA).  Diabetes  Have regular diabetes screenings. This checks your fasting blood sugar level. Have the screening done:  Once every three years after age 66 if you are at a normal weight and have a low risk for diabetes.  More often and at a younger age if you are overweight or have a high risk for diabetes.  What should I know about preventing infection?  Hepatitis B  If you have a higher risk for hepatitis B, you should be screened for this virus. Talk with your health care provider to find out if you are at risk for hepatitis B infection.  Hepatitis C  Blood testing is recommended for:  Everyone born from [birth year] through 1964.  Anyone  with known risk factors for hepatitis C.  Sexually transmitted infections (STIs)  You should be screened each year for STIs, including gonorrhea and chlamydia, if:  You are sexually active and are younger than 53 years of age.  You are older than 52 years of age and your health care provider tells you that you are at risk for this type of infection.  Your sexual activity has changed since you were last screened, and you are at increased risk for chlamydia or gonorrhea. Ask your health care provider if you are at risk.  Ask your health care provider about whether you are at high risk for HIV. Your health care provider  may recommend a prescription medicine to help prevent HIV infection. If you choose to take medicine to prevent HIV, you should first get tested for HIV. You should then be tested every 3 months for as long as you are taking the medicine.  Follow these instructions at home:  Alcohol use  Do not drink alcohol if your health care provider tells you not to drink.  If you drink alcohol:  Limit how much you have to 0-2 drinks a day.  Know how much alcohol is in your drink. In the [place] one drink equals one 12 oz bottle of beer (355 mL), one 5 oz glass of wine (148 mL), or one 1 oz glass of hard liquor (44 mL).  Lifestyle  Do not use any products that contain nicotine or tobacco. These products include cigarettes, chewing tobacco, and vaping devices, such as e-cigarettes. If you need help quitting, ask your health care provider.  Do not use street drugs.  Do not share needles.  Ask your health care provider for help if you need support or information about quitting drugs.  General instructions  Schedule regular health, dental, and eye exams.  Stay current with your vaccines.  Tell your health care provider if:  You often feel depressed.  You have ever been abused or do not feel safe at home.  Summary  Adopting a healthy lifestyle and getting preventive care are important in promoting health and wellness.  Follow your health care provider's instructions about healthy diet, exercising, and getting tested or screened for diseases.  Follow your health care provider's instructions on monitoring your cholesterol and blood pressure.  This information is not intended to replace advice given to you by your health care provider. Make sure you discuss any questions you have with your health care provider.  Document Revised: 11/17/2020 Document Reviewed: 11/17/2020  Elsevier Patient Education  2024 [employer].

## 2024-03-17 LAB — CBC WITH DIFFERENTIAL/PLATELET
Basophils Absolute: 0 x10E3/uL (ref 0.0–0.2)
Basos: 0 %
EOS (ABSOLUTE): 0.2 x10E3/uL (ref 0.0–0.4)
Eos: 2 %
Hematocrit: 44.9 % (ref 37.5–51.0)
Hemoglobin: 14.2 g/dL (ref 13.0–17.7)
Immature Grans (Abs): 0 x10E3/uL (ref 0.0–0.1)
Immature Granulocytes: 0 %
Lymphocytes Absolute: 1.9 x10E3/uL (ref 0.7–3.1)
Lymphs: 27 %
MCH: 29.8 pg (ref 26.6–33.0)
MCHC: 31.6 g/dL (ref 31.5–35.7)
MCV: 94 fL (ref 79–97)
Monocytes Absolute: 0.6 x10E3/uL (ref 0.1–0.9)
Monocytes: 8 %
Neutrophils Absolute: 4.5 x10E3/uL (ref 1.4–7.0)
Neutrophils: 63 %
Platelets: 296 x10E3/uL (ref 150–450)
RBC: 4.76 x10E6/uL (ref 4.14–5.80)
RDW: 12.1 % (ref 11.6–15.4)
WBC: 7.2 x10E3/uL (ref 3.4–10.8)

## 2024-03-17 LAB — CMP14+EGFR
ALT: 28 IU/L (ref 0–44)
AST: 20 IU/L (ref 0–40)
Albumin: 4.2 g/dL (ref 3.8–4.9)
Alkaline Phosphatase: 91 IU/L (ref 44–121)
BUN/Creatinine Ratio: 13 (ref 9–20)
BUN: 12 mg/dL (ref 6–24)
Bilirubin Total: 0.3 mg/dL (ref 0.0–1.2)
CO2: 24 mmol/L (ref 20–29)
Calcium: 9.5 mg/dL (ref 8.7–10.2)
Chloride: 101 mmol/L (ref 96–106)
Creatinine, Ser: 0.93 mg/dL (ref 0.76–1.27)
Globulin, Total: 2.7 g/dL (ref 1.5–4.5)
Glucose: 109 mg/dL — ABNORMAL HIGH (ref 70–99)
Potassium: 3.9 mmol/L (ref 3.5–5.2)
Sodium: 140 mmol/L (ref 134–144)
Total Protein: 6.9 g/dL (ref 6.0–8.5)
eGFR: 99 mL/min/1.73 (ref >59)

## 2024-03-17 LAB — PSA, TOTAL AND FREE
PSA, Free Pct: 12.3 %
PSA, Free: 1.12 ng/mL
Prostate Specific Ag, Serum: 9.1 ng/mL — ABNORMAL HIGH (ref 0.0–4.0)

## 2024-03-17 LAB — LIPID PANEL
Chol/HDL Ratio: 3.1 ratio (ref 0.0–5.0)
Cholesterol, Total: 120 mg/dL (ref 100–199)
HDL: 39 mg/dL — ABNORMAL LOW (ref >39)
LDL Chol Calc (NIH): 61 mg/dL (ref 0–99)
Triglycerides: 105 mg/dL (ref 0–149)
VLDL Cholesterol Cal: 20 mg/dL (ref 5–40)

## 2024-03-17 LAB — TSH: TSH: 1.53 u[IU]/mL (ref 0.450–4.500)

## 2024-03-19 ENCOUNTER — Ambulatory Visit: Payer: Self-pay | Admitting: Family

## 2024-03-19 DIAGNOSIS — R972 Elevated prostate specific antigen [PSA]: Secondary | ICD-10-CM

## 2024-04-25 ENCOUNTER — Telehealth: Payer: Self-pay

## 2024-04-25 NOTE — Telephone Encounter (Unsigned)
 Copied from CRM #[id number]. Topic: Referral - Status >> Apr 25, 2024 10:41 AM Turkey B wrote: Reason for CRM: Patient called in about referral status for urology, pending since Sept 8 . Please cb with update

## 2024-04-26 NOTE — Telephone Encounter (Signed)
 Referral sent to: [facility] [address] [place] [address] [phone]  MyChart Message sent to Patient with specialty Office contact information.

## 2024-04-26 NOTE — Telephone Encounter (Signed)
 Called and spoke with patient and gave him referral info.

## 2024-05-28 ENCOUNTER — Ambulatory Visit: Admitting: Nurse Practitioner

## 2024-05-28 ENCOUNTER — Encounter: Payer: Self-pay | Admitting: Nurse Practitioner

## 2024-05-28 VITALS — BP 130/78 | HR 88 | Temp 97.2°F | Ht 72.0 in | Wt 290.6 lb

## 2024-05-28 DIAGNOSIS — I10 Essential (primary) hypertension: Secondary | ICD-10-CM | POA: Diagnosis not present

## 2024-05-28 DIAGNOSIS — H66002 Acute suppurative otitis media without spontaneous rupture of ear drum, left ear: Secondary | ICD-10-CM | POA: Diagnosis not present

## 2024-05-28 DIAGNOSIS — H6522 Chronic serous otitis media, left ear: Secondary | ICD-10-CM | POA: Insufficient documentation

## 2024-05-28 DIAGNOSIS — J069 Acute upper respiratory infection, unspecified: Secondary | ICD-10-CM | POA: Diagnosis not present

## 2024-05-28 MED ORDER — GUAIFENESIN 400 MG PO TABS: 400.0000 mg | ORAL_TABLET | ORAL | 0 refills | Status: AC

## 2024-05-28 MED ORDER — AMOXICILLIN 875 MG PO TABS: 875.0000 mg | ORAL_TABLET | Freq: Two times a day (BID) | ORAL | 0 refills | Status: AC

## 2024-05-28 NOTE — Progress Notes (Signed)
 Subjective:  Patient ID: Logan Smith, male    DOB: 1971-04-28, 53 y.o.   MRN: [id number]  Patient Care Team: Lavell Bari LABOR, FNP as PCP - General (Family Medicine)   Chief Complaint:  Cough (Symptoms started Friday ) and Nasal Congestion   HPI: Logan Smith is a 53 y.o. male presenting on 05/28/2024 for Cough (Symptoms started Friday ) and Nasal Congestion   Discussed the use of AI scribe software for clinical note transcription with the patient, who gave verbal consent to proceed.  History of Present Illness Logan Smith is a 53 year old male with hypertension who presents with cough and congestion.  He has been experiencing cough and congestion, along with rhinorrhea, which began on Friday, May 25, 2024. The symptoms worsened around that time. He denies any recent contact with sick individuals and attributes his symptoms to the weather. He has been taking over-the-counter cough syrup, which provides minimal relief. The sputum is described as yellow in color. No sore throat is present, but there is mild otalgia in the left ear.  He has a history of hypertension and is currently taking losartan 10-12.5 mg. He admits to a high intake of sodium.      Relevant past medical, surgical, family, and social history reviewed and updated as indicated.  Allergies and medications reviewed and updated. Data reviewed: Chart in Epic.   Past Medical History:  Diagnosis Date   Back pain    Erectile dysfunction    Hyperlipidemia    Hypertension     Past Surgical History:  Procedure Laterality Date   COLONOSCOPY N/A 11/21/2020   Procedure: COLONOSCOPY;  Surgeon: Mavis Anes, MD;  Location: [facility];  Service: Gastroenterology;  Laterality: N/A;   HEMORROIDECTOMY     VASECTOMY      Social History   Socioeconomic History   Marital status: Single    Spouse name: Not on file   Number of children: Not on file   Years of education: Not on file   Highest education  level: Not on file  Occupational History   Not on file  Tobacco Use   Smoking status: Never   Smokeless tobacco: Never  Vaping Use   Vaping status: Never Used  Substance and Sexual Activity   Alcohol use: No    Comment: rarely   Drug use: No   Sexual activity: Not on file  Other Topics Concern   Not on file  Social History Narrative   Not on file   Social Drivers of Health   Financial Resource Strain: Not on file  Food Insecurity: Not on file  Transportation Needs: Not on file  Physical Activity: Not on file  Stress: Not on file  Social Connections: Not on file  Intimate Partner Violence: Not on file    Outpatient Encounter Medications as of 05/28/2024  Medication Sig   amLODipine  (NORVASC ) 5 MG tablet Take 1 tablet (5 mg total) by mouth daily.   amoxicillin  (AMOXIL ) 875 MG tablet Take 1 tablet (875 mg total) by mouth 2 (two) times daily for 10 days.   benazepril -hydrochlorthiazide (LOTENSIN  HCT) 10-12.5 MG tablet Take 1 tablet by mouth daily.   cetirizine  (ZYRTEC ) 10 MG tablet Take 1 tablet (10 mg total) by mouth daily.   fish oil-omega-3 fatty acids 1000 MG capsule Take 2 g by mouth daily.   guaifenesin  (HUMIBID E) 400 MG TABS tablet Take 1 tablet (400 mg total) by mouth every 4 (four) hours.   hydrocortisone  (ANUSOL -HC)  25 MG suppository Place 1 suppository (25 mg total) rectally 2 (two) times daily. (Patient taking differently: Place 25 mg rectally 2 (two) times daily as needed for hemorrhoids.)   Multiple Vitamins-Minerals (MENS 50+ MULTI VITAMIN/MIN PO) Take 1 tablet by mouth daily.   naproxen  (NAPROSYN ) 500 MG tablet Take 1 tablet (500 mg total) by mouth 2 (two) times daily with a meal.   pramoxine-hydrocortisone  (PROCTOCREAM-HC) 1-1 % rectal cream Place 1 application rectally 2 (two) times daily. (Patient taking differently: Place 1 application  rectally 2 (two) times daily as needed for hemorrhoids.)   rosuvastatin  (CRESTOR ) 10 MG tablet Take 1 tablet (10 mg total)  by mouth daily.   sildenafil  (VIAGRA ) 25 MG tablet Take 1 tablet (25 mg total) by mouth daily as needed for erectile dysfunction.   No facility-administered encounter medications on file as of 05/28/2024.    No Known Allergies  Review of Systems  Constitutional:  Negative for chills and fever.  HENT:  Positive for congestion and ear pain. Negative for hearing loss, sore throat and tinnitus.   Respiratory:  Positive for cough. Negative for shortness of breath and wheezing.        Green sputum for 6 days  Cardiovascular:  Negative for chest pain and leg swelling.  Gastrointestinal:  Negative for blood in stool, constipation, diarrhea, nausea and vomiting.  Skin:  Negative for itching and rash.  Neurological:  Negative for dizziness and headaches.         Objective:  BP 130/78   Pulse 88   Temp (!) 97.2 F (36.2 C) (Temporal)   Ht 6' (1.829 m)   Wt 290 lb 9.6 oz (131.8 kg)   SpO2 96%   BMI 39.41 kg/m    Wt Readings from Last 3 Encounters:  05/28/24 290 lb 9.6 oz (131.8 kg)  03/16/24 291 lb 3.2 oz (132.1 kg)  10/28/22 287 lb (130.2 kg)   BP Readings from Last 3 Encounters:  05/28/24 130/78  03/16/24 125/79  10/28/22 123/78    Physical Exam Vitals and nursing note reviewed.  Constitutional:      General: He is not in acute distress.    Appearance: He is obese.  HENT:     Head: Normocephalic and atraumatic.     Right Ear: Hearing, tympanic membrane, ear canal and external ear normal.     Left Ear: Tenderness present. Tympanic membrane is erythematous and bulging.     Nose:     Right Turbinates: Swollen.     Left Turbinates: Swollen.     Right Sinus: No maxillary sinus tenderness or frontal sinus tenderness.     Left Sinus: Frontal sinus tenderness present. No maxillary sinus tenderness.     Mouth/Throat:     Mouth: Mucous membranes are moist.     Pharynx: Postnasal drip present.  Eyes:     General: No scleral icterus.    Extraocular Movements: Extraocular  movements intact.     Conjunctiva/sclera: Conjunctivae normal.     Pupils: Pupils are equal, round, and reactive to light.  Cardiovascular:     Heart sounds: Normal heart sounds.  Pulmonary:     Effort: Pulmonary effort is normal.     Breath sounds: Normal breath sounds.  Musculoskeletal:        General: Normal range of motion.     Right lower leg: No edema.     Left lower leg: No edema.  Skin:    General: Skin is warm and dry.  Neurological:  Mental Status: He is alert and oriented to person, place, and time.  Psychiatric:        Mood and Affect: Mood normal.        Behavior: Behavior normal.        Thought Content: Thought content normal.        Judgment: Judgment normal.    Physical Exam VITALS: BP- 150/83     Results for orders placed or performed in visit on 03/16/24  CMP14+EGFR   Collection Time: 03/16/24 11:31 AM  Result Value Ref Range   Glucose 109 (H) 70 - 99 mg/dL   BUN 12 6 - 24 mg/dL   Creatinine, Ser 9.06 0.76 - 1.27 mg/dL   eGFR 99 >40 fO/fpw/8.26   BUN/Creatinine Ratio 13 9 - 20   Sodium 140 134 - 144 mmol/L   Potassium 3.9 3.5 - 5.2 mmol/L   Chloride 101 96 - 106 mmol/L   CO2 24 20 - 29 mmol/L   Calcium  9.5 8.7 - 10.2 mg/dL   Total Protein 6.9 6.0 - 8.5 g/dL   Albumin 4.2 3.8 - 4.9 g/dL   Globulin, Total 2.7 1.5 - 4.5 g/dL   Bilirubin Total 0.3 0.0 - 1.2 mg/dL   Alkaline Phosphatase 91 44 - 121 IU/L   AST 20 0 - 40 IU/L   ALT 28 0 - 44 IU/L  CBC with Differential/Platelet   Collection Time: 03/16/24 11:31 AM  Result Value Ref Range   WBC 7.2 3.4 - 10.8 x10E3/uL   RBC 4.76 4.14 - 5.80 x10E6/uL   Hemoglobin 14.2 13.0 - 17.7 g/dL   Hematocrit 55.0 62.4 - 51.0 %   MCV 94 79 - 97 fL   MCH 29.8 26.6 - 33.0 pg   MCHC 31.6 31.5 - 35.7 g/dL   RDW 87.8 88.3 - 84.5 %   Platelets 296 150 - 450 x10E3/uL   Neutrophils 63 Not Estab. %   Lymphs 27 Not Estab. %   Monocytes 8 Not Estab. %   Eos 2 Not Estab. %   Basos 0 Not Estab. %   Neutrophils  Absolute 4.5 1.4 - 7.0 x10E3/uL   Lymphocytes Absolute 1.9 0.7 - 3.1 x10E3/uL   Monocytes Absolute 0.6 0.1 - 0.9 x10E3/uL   EOS (ABSOLUTE) 0.2 0.0 - 0.4 x10E3/uL   Basophils Absolute 0.0 0.0 - 0.2 x10E3/uL   Immature Granulocytes 0 Not Estab. %   Immature Grans (Abs) 0.0 0.0 - 0.1 x10E3/uL  Lipid panel   Collection Time: 03/16/24 11:31 AM  Result Value Ref Range   Cholesterol, Total 120 100 - 199 mg/dL   Triglycerides 894 0 - 149 mg/dL   HDL 39 (L) >60 mg/dL   VLDL Cholesterol Cal 20 5 - 40 mg/dL   LDL Chol Calc (NIH) 61 0 - 99 mg/dL   Chol/HDL Ratio 3.1 0.0 - 5.0 ratio  PSA, total and free   Collection Time: 03/16/24 11:31 AM  Result Value Ref Range   Prostate Specific Ag, Serum 9.1 (H) 0.0 - 4.0 ng/mL   PSA, Free 1.12 N/A ng/mL   PSA, Free Pct 12.3 %  TSH   Collection Time: 03/16/24 11:31 AM  Result Value Ref Range   TSH 1.530 0.450 - 4.500 uIU/mL       Pertinent labs & imaging results that were available during my care of the patient were reviewed by me and considered in my medical decision making.  Assessment & Plan:  Remer was seen today for cough and nasal congestion.  Diagnoses and all orders for this visit:  URI with cough and congestion -     amoxicillin  (AMOXIL ) 875 MG tablet; Take 1 tablet (875 mg total) by mouth 2 (two) times daily for 10 days. -     guaifenesin  (HUMIBID E) 400 MG TABS tablet; Take 1 tablet (400 mg total) by mouth every 4 (four) hours.  Primary hypertension  Non-recurrent acute suppurative otitis media of left ear without spontaneous rupture of tympanic membrane -     amoxicillin  (AMOXIL ) 875 MG tablet; Take 1 tablet (875 mg total) by mouth 2 (two) times daily for 10 days.     Assessment and Plan Iliya is a 53 year old African-American male seen today for otitis media symptoms, no acute distress Assessment & Plan Acute upper respiratory infection Cough and congestion with yellow sputum. No fever or sore throat. Antibiotics indicated as  cough duration has been greater than seven days. - Prescribed amoxicillin  1 tablet twice a day for 5 days. - Prescribed guaifenesin  400 mg, 1 tablet up to 3 times a day as needed. - Advised to take a full glass of water  with each guaifenesin  tablet. - Instructed to stop guaifenesin  once the cough resolves.  Acute suppurative otitis media, left ear Left ear pain consistent with ear infection. Antibiotics for respiratory infection will also treat ear infection. - Prescribed amoxicillin  1 tablet twice a day for 5 days.  Essential hypertension Blood pressure 150/83 mmHg, managed with losartan and amlodipine . High sodium intake noted. Advised against DM-containing cough medications due to potential blood pressure increase. - Advised to decrease sodium intake. - Instructed to avoid cough medications containing DM.      Continue all other maintenance medications.  Follow up plan: Return if symptoms worsen or fail to improve.   Continue healthy lifestyle choices, including diet (rich in fruits, vegetables, and lean proteins, and low in salt and simple carbohydrates) and exercise (at least 30 minutes of moderate physical activity daily).  Educational handout given for    Clinical References  Otitis Media, Adult  Otitis media is a condition in which the middle ear is red and swollen (inflamed) and full of fluid. The middle ear is the part of the ear that contains bones for hearing as well as air that helps send sounds to the brain. The condition usually goes away on its own. What are the causes? This condition is caused by a blockage in the eustachian tube. This tube connects the middle ear to the back of the nose. It normally allows air into the middle ear. The blockage is caused by fluid or swelling. Problems that can cause blockage include: A cold or infection that affects the nose, mouth, or throat. Allergies. An irritant, such as tobacco smoke. Adenoids that have become large. The  adenoids are soft tissue located in the back of the throat, behind the nose and the roof of the mouth. Growth or swelling in the upper part of the throat, just behind the nose (nasopharynx). Damage to the ear caused by a change in pressure. This is called barotrauma. What increases the risk? You are more likely to develop this condition if you: Smoke or are exposed to tobacco smoke. Have an opening in the roof of your mouth (cleft palate). Have acid reflux. Have problems in your body's defense system (immune system). What are the signs or symptoms? Symptoms of this condition include: Ear pain. Fever. Problems with hearing. Being tired. Fluid leaking from the ear. Ringing in the ear. How is this  treated? This condition can go away on its own within 3-5 days. But if the condition is caused by germs (bacteria) and does not go away on its own, or if it keeps coming back, your doctor may: Give you antibiotic medicines. Give you medicines for pain. Follow these instructions at home: Take over-the-counter and prescription medicines only as told by your doctor. If you were prescribed an antibiotic medicine, take it as told by your doctor. Do not stop taking it even if you start to feel better. Keep all follow-up visits. Contact a doctor if: You have bleeding from your nose. There is a lump on your neck. You are not feeling better in 5 days. You feel worse instead of better. Get help right away if: You have pain that is not helped with medicine. You have swelling, redness, or pain around your ear. You get a stiff neck. You cannot move part of your face (paralysis). You notice that the bone behind your ear hurts when you touch it. You get a very bad headache. Summary Otitis media means that the middle ear is red, swollen, and full of fluid. This condition usually goes away on its own. If the problem does not go away, treatment may be needed. You may be given medicines to treat the  infection or to treat your pain. If you were prescribed an antibiotic medicine, take it as told by your doctor. Do not stop taking it even if you start to feel better. Keep all follow-up visits. This information is not intended to replace advice given to you by your health care provider. Make sure you discuss any questions you have with your health care provider. Document Revised: 10/06/2020 Document Reviewed: 10/06/2020 Elsevier Patient Education  2024 [employer] Hypertension, Adult Hypertension is another name for high blood pressure. High blood pressure forces your heart to work harder to pump blood. This can cause problems over time. There are two numbers in a blood pressure reading. There is a top number (systolic) over a bottom number (diastolic). It is best to have a blood pressure that is below 120/80. What are the causes? The cause of this condition is not known. Some other conditions can lead to high blood pressure. What increases the risk? Some lifestyle factors can make you more likely to develop high blood pressure: Smoking. Not getting enough exercise or physical activity. Being overweight. Having too much fat, sugar, calories, or salt (sodium) in your diet. Drinking too much alcohol. Other risk factors include: Having any of these conditions: Heart disease. Diabetes. High cholesterol. Kidney disease. Obstructive sleep apnea. Having a family history of high blood pressure and high cholesterol. Age. The risk increases with age. Stress. What are the signs or symptoms? High blood pressure may not cause symptoms. Very high blood pressure (hypertensive crisis) may cause: Headache. Fast or uneven heartbeats (palpitations). Shortness of breath. Nosebleed. Vomiting or feeling like you may vomit (nauseous). Changes in how you see. Very bad chest pain. Feeling dizzy. Seizures. How is this treated? This condition is treated by making healthy lifestyle changes, such  as: Eating healthy foods. Exercising more. Drinking less alcohol. Your doctor may prescribe medicine if lifestyle changes do not help enough and if: Your top number is above 130. Your bottom number is above 80. Your personal target blood pressure may vary. Follow these instructions at home: Eating and drinking  If told, follow the DASH eating plan. To follow this plan: Fill one half of your plate at each meal with fruits  and vegetables. Fill one fourth of your plate at each meal with whole grains. Whole grains include whole-wheat pasta, brown rice, and whole-grain bread. Eat or drink low-fat dairy products, such as skim milk or low-fat yogurt. Fill one fourth of your plate at each meal with low-fat (lean) proteins. Low-fat proteins include fish, chicken without skin, eggs, beans, and tofu. Avoid fatty meat, cured and processed meat, or chicken with skin. Avoid pre-made or processed food. Limit the amount of salt in your diet to less than 1,500 mg each day. Do not drink alcohol if: Your doctor tells you not to drink. You are pregnant, may be pregnant, or are planning to become pregnant. If you drink alcohol: Limit how much you have to: 0-1 drink a day for women. 0-2 drinks a day for men. Know how much alcohol is in your drink. In the [place] one drink equals one 12 oz bottle of beer (355 mL), one 5 oz glass of wine (148 mL), or one 1 oz glass of hard liquor (44 mL). Lifestyle  Work with your doctor to stay at a healthy weight or to lose weight. Ask your doctor what the best weight is for you. Get at least 30 minutes of exercise that causes your heart to beat faster (aerobic exercise) most days of the week. This may include walking, swimming, or biking. Get at least 30 minutes of exercise that strengthens your muscles (resistance exercise) at least 3 days a week. This may include lifting weights or doing Pilates. Do not smoke or use any products that contain nicotine or tobacco. If you  need help quitting, ask your doctor. Check your blood pressure at home as told by your doctor. Keep all follow-up visits. Medicines Take over-the-counter and prescription medicines only as told by your doctor. Follow directions carefully. Do not skip doses of blood pressure medicine. The medicine does not work as well if you skip doses. Skipping doses also puts you at risk for problems. Ask your doctor about side effects or reactions to medicines that you should watch for. Contact a doctor if: You think you are having a reaction to the medicine you are taking. You have headaches that keep coming back. You feel dizzy. You have swelling in your ankles. You have trouble with your vision. Get help right away if: You get a very bad headache. You start to feel mixed up (confused). You feel weak or numb. You feel faint. You have very bad pain in your: Chest. Belly (abdomen). You vomit more than once. You have trouble breathing. These symptoms may be an emergency. Get help right away. Call 911. Do not wait to see if the symptoms will go away. Do not drive yourself to the hospital. Summary Hypertension is another name for high blood pressure. High blood pressure forces your heart to work harder to pump blood. For most people, a normal blood pressure is less than 120/80. Making healthy choices can help lower blood pressure. If your blood pressure does not get lower with healthy choices, you may need to take medicine. This information is not intended to replace advice given to you by your health care provider. Make sure you discuss any questions you have with your health care provider. Document Revised: 04/16/2021 Document Reviewed: 04/16/2021 Elsevier Patient Education  2024 [employer] Preventing Hypertension Hypertension, also called high blood pressure, is when the force of blood pumping through the arteries is too strong. Arteries are blood vessels that carry blood from the heart  throughout the  body. Often, hypertension does not cause symptoms until blood pressure is very high. It is important to have your blood pressure checked regularly. Diet and lifestyle changes can help you prevent hypertension, and they may make you feel better overall and improve your quality of life. If you already have hypertension, you may control it with diet and lifestyle changes, as well as with medicine. How can this condition affect me? Over time, hypertension can damage the arteries and decrease blood flow to important parts of the body, including the brain, heart, and kidneys. By keeping your blood pressure in a healthy range, you can help prevent complications like heart attack, heart failure, stroke, kidney failure, and vascular dementia. What can increase my risk? An unhealthy diet and a lack of physical activity can make you more likely to develop high blood pressure. Some other risk factors include: Age. The risk increases with age. Having family members who have had high blood pressure. Having certain health conditions, such as thyroid  problems. Being overweight or obese. Drinking too much alcohol or caffeine. Having too much fat, sugar, calories, or salt (sodium) in your diet. Smoking or using illegal drugs. Taking certain medicines, such as antidepressants, decongestants, birth control pills, and NSAIDs, such as ibuprofen. What actions can I take to prevent or manage this condition? Work with your health care provider to make a hypertension prevention plan that works for you. You may be referred for counseling on a healthy diet and physical activity. Follow your plan and keep all follow-up visits. Diet changes Maintain a healthy diet. This includes: Eating less salt (sodium). Ask your health care provider how much sodium is safe for you to have. The general recommendation is to have less than 1 tsp (2,300 mg) of sodium a day. Do not add salt to your food. Choose low-sodium options  when grocery shopping and eating out. Limiting fats in your diet. You can do this by eating low-fat or fat-free dairy products and by eating less red meat. Eating more fruits, vegetables, and whole grains. Make a goal to eat: 1-2 cups of fresh fruits and vegetables each day. 3-4 servings of whole grains each day. Avoiding foods and beverages that have added sugars. Eating fish that contain healthy fats (omega-3 fatty acids), such as mackerel or salmon. If you need help putting together a healthy eating plan, try the DASH diet. This diet is high in fruits, vegetables, and whole grains. It is low in sodium, red meat, and added sugars. DASH stands for Dietary Approaches to Stop Hypertension. Lifestyle changes  Lose weight if you are overweight. Losing just 3-5% of your body weight can help prevent or control hypertension. For example, if your present weight is 200 lb (91 kg), a loss of 3-5% of your weight means losing 6-10 lb (2.7-4.5 kg). Ask your health care provider to help you with a diet and exercise plan to safely lose weight. Get enough exercise. Do at least 150 minutes of moderate-intensity exercise each week. You could do this in short exercise sessions several times a day, or you could do longer exercise sessions a few times a week. For example, you could take a brisk 10-minute walk or bike ride, 3 times a day, for 5 days a week. Find ways to reduce stress, such as exercising, meditating, listening to music, or taking a yoga class. If you need help reducing stress, ask your health care provider. Do not use any products that contain nicotine or tobacco. These products include cigarettes, chewing tobacco,  and vaping devices, such as e-cigarettes. Chemicals in tobacco and nicotine products raise your blood pressure each time you use them. If you need help quitting, ask your health care provider. Learn how to check your blood pressure at home. Make sure that you know your personal target blood  pressure, as told by your health care provider. Try to sleep 7-9 hours per night. Alcohol use Do not drink alcohol if: Your health care provider tells you not to drink. You are pregnant, may be pregnant, or are planning to become pregnant. If you drink alcohol: Limit how much you have to: 0-1 drink a day for women. 0-2 drinks a day for men. Know how much alcohol is in your drink. In the [place] one drink equals one 12 oz bottle of beer (355 mL), one 5 oz glass of wine (148 mL), or one 1 oz glass of hard liquor (44 mL). Medicines In addition to diet and lifestyle changes, your health care provider may recommend medicines to help lower your blood pressure. In general: You may need to try a few different medicines to find what works best for you. You may need to take more than one medicine. Take over-the-counter and prescription medicines only as told by your health care provider. Questions to ask your health care provider What is my blood pressure goal? How can I lower my risk for high blood pressure? How should I monitor my blood pressure at home? Where to find support Your health care provider can help you prevent hypertension and help you keep your blood pressure at a healthy level. Your local hospital or your community may also provide support services and prevention programs. The American Heart Association offers an online support network at supportnetwork.heart.org Where to find more information Learn more about hypertension from: National Heart, Lung, and Blood Institute: popsteam.is Centers for Disease Control and Prevention: footballexhibition.com.br American Academy of Family Physicians: familydoctor.org Learn more about the DASH diet from: National Heart, Lung, and Blood Institute: popsteam.is Contact a health care provider if: You think you are having a reaction to medicines you have taken. You have recurrent headaches or feel dizzy. You have swelling in your ankles. You have  trouble with your vision. Get help right away if: You have sudden, severe chest, back, or abdominal pain or discomfort. You have shortness of breath. You have a sudden, severe headache. These symptoms may be an emergency. Get help right away. Call 911. Do not wait to see if the symptoms will go away. Do not drive yourself to the hospital. Summary Hypertension often does not cause any symptoms until blood pressure is very high. It is important to get your blood pressure checked regularly. Diet and lifestyle changes are important steps in preventing hypertension. By keeping your blood pressure in a healthy range, you may prevent complications like heart attack, heart failure, stroke, and kidney failure. Work with your health care provider to make a hypertension prevention plan that works for you. This information is not intended to replace advice given to you by your health care provider. Make sure you discuss any questions you have with your health care provider. Document Revised: 04/16/2021 Document Reviewed: 04/16/2021 Elsevier Patient Education  2024 Elsevier Inc. Upper Respiratory Infection, Adult An upper respiratory infection (URI) affects the nose, throat, and upper airways that lead to the lungs. The most common type of URI is often called the common cold. URIs usually get better on their own, without medical treatment. What are the causes? A URI is  caused by a germ (virus). You may catch these germs by: Breathing in droplets from an infected person's cough or sneeze. Touching something that has the germ on it (is contaminated) and then touching your mouth, nose, or eyes. What increases the risk? You are more likely to get a URI if: You are very young or very old. You have close contact with others, such as at work, school, or a health care facility. You smoke. You have long-term (chronic) heart or lung disease. You have a weakened disease-fighting system (immune system). You  have nasal allergies or asthma. You have a lot of stress. You have poor nutrition. What are the signs or symptoms? Runny or stuffy (congested) nose. Cough. Sneezing. Sore throat. Headache. Feeling tired (fatigue). Fever. Not wanting to eat as much as usual. Pain in your forehead, behind your eyes, and over your cheekbones (sinus pain). Muscle aches. Redness or irritation of the eyes. Pressure in the ears or face. How is this treated? URIs usually get better on their own within 7-10 days. Medicines cannot cure URIs, but your doctor may recommend certain medicines to help relieve symptoms, such as: Over-the-counter cold medicines. Medicines to reduce coughing (cough suppressants). Coughing is a type of defense against infection that helps to clear the nose, throat, windpipe, and lungs (respiratory system). Take these medicines only as told by your doctor. Medicines to lower your fever. Follow these instructions at home: Activity Rest as needed. If you have a fever, stay home from work or school until your fever is gone, or until your doctor says you may return to work or school. You should stay home until you cannot spread the infection anymore (you are not contagious). Your doctor may have you wear a face mask so you have less risk of spreading the infection. Relieving symptoms Rinse your mouth often with salt water . To make salt water , dissolve -1 tsp (3-6 g) of salt in 1 cup (237 mL) of warm water . Use a cool-mist humidifier to add moisture to the air. This can help you breathe more easily. Eating and drinking  Drink enough fluid to keep your pee (urine) pale yellow. Eat soups and other clear broths. General instructions  Take over-the-counter and prescription medicines only as told by your doctor. Do not smoke or use any products that contain nicotine or tobacco. If you need help quitting, ask your doctor. Avoid being where people are smoking (avoid secondhand smoke). Stay  up to date on all your shots (immunizations), and get the flu shot every year. Keep all follow-up visits. How to prevent the spread of infection to others  Wash your hands with soap and water  for at least 20 seconds. If you cannot use soap and water , use hand sanitizer. Avoid touching your mouth, face, eyes, or nose. Cough or sneeze into a tissue or your sleeve or elbow. Do not cough or sneeze into your hand or into the air. Contact a doctor if: You are getting worse, not better. You have any of these: A fever or chills. Brown or red mucus in your nose. Yellow or brown fluid (discharge)coming from your nose. Pain in your face, especially when you bend forward. Swollen neck glands. Pain when you swallow. White areas in the back of your throat. Get help right away if: You have shortness of breath that gets worse. You have very bad or constant: Headache. Ear pain. Pain in your forehead, behind your eyes, and over your cheekbones (sinus pain). Chest pain. You have long-lasting (chronic)  lung disease along with any of these: Making high-pitched whistling sounds when you breathe, most often when you breathe out (wheezing). Long-lasting cough (more than 14 days). Coughing up blood. A change in your usual mucus. You have a stiff neck. You have changes in your: Vision. Hearing. Thinking. Mood. These symptoms may be an emergency. Get help right away. Call 911. Do not wait to see if the symptoms will go away. Do not drive yourself to the hospital. Summary An upper respiratory infection (URI) is caused by a germ (virus). The most common type of URI is often called the common cold. URIs usually get better within 7-10 days. Take over-the-counter and prescription medicines only as told by your doctor. This information is not intended to replace advice given to you by your health care provider. Make sure you discuss any questions you have with your health care provider. Document Revised:  01/28/2021 Document Reviewed: 01/28/2021 Elsevier Patient Education  2024 Elsevier Inc. USE yeah all yeah yes yeah okay no I do not want you back thank you.  Good morning how you doing good  The above assessment and management plan was discussed with the patient. The patient verbalized understanding of and has agreed to the management plan. Patient is aware to call the clinic if they develop any new symptoms or if symptoms persist or worsen. Patient is aware when to return to the clinic for a follow-up visit. Patient educated on when it is appropriate to go to the emergency department.   Naidelin Gugliotta [place] Thompson, DNP [facility] [address] [place], [place] [address] [phone]

## 2024-05-30 ENCOUNTER — Telehealth: Payer: Self-pay

## 2024-05-30 NOTE — Telephone Encounter (Signed)
 Copied from CRM [id number]. Topic: Appointments - Scheduling Inquiry for Clinic >> May 30, 2024  1:37 PM Harlene ORN wrote: Reason for CRM: Patient needs a doctor's note saying it's okay to return to work this Thursday on 05/31/2024. Will come to pick up the note in the morning.

## 2024-06-01 ENCOUNTER — Encounter: Payer: Self-pay | Admitting: *Deleted

## 2024-06-01 NOTE — Telephone Encounter (Signed)
 Letter written and printed for patient to pick up.  Left detailed message for patient per signed dpr.

## 2024-06-22 ENCOUNTER — Ambulatory Visit: Admitting: Urology

## 2024-07-02 ENCOUNTER — Encounter: Payer: Self-pay | Admitting: Nurse Practitioner

## 2024-07-02 ENCOUNTER — Ambulatory Visit: Payer: Self-pay

## 2024-07-02 ENCOUNTER — Ambulatory Visit (INDEPENDENT_AMBULATORY_CARE_PROVIDER_SITE_OTHER): Admitting: Nurse Practitioner

## 2024-07-02 VITALS — BP 162/104 | HR 87 | Temp 98.5°F | Ht 72.0 in | Wt 290.0 lb

## 2024-07-02 DIAGNOSIS — I10 Essential (primary) hypertension: Secondary | ICD-10-CM

## 2024-07-02 DIAGNOSIS — R42 Dizziness and giddiness: Secondary | ICD-10-CM

## 2024-07-02 MED ORDER — MECLIZINE HCL 25 MG PO TABS: 25.0000 mg | ORAL_TABLET | Freq: Three times a day (TID) | ORAL | 0 refills | Status: AC | PRN

## 2024-07-02 MED ORDER — BENAZEPRIL-HYDROCHLOROTHIAZIDE 20-25 MG PO TABS: 1.0000 | ORAL_TABLET | Freq: Every day | ORAL | 1 refills | Status: DC

## 2024-07-02 NOTE — Patient Instructions (Signed)

## 2024-07-02 NOTE — Telephone Encounter (Signed)
 Noted

## 2024-07-02 NOTE — Telephone Encounter (Signed)
 FYI Only or Action Required?: FYI only for provider: appointment scheduled on 07/02/2024.  Patient was last seen in primary care on 05/28/2024 by Logan Morton Sebastian Nena, NP.  Called Nurse Triage reporting Dizziness.  Symptoms began a week ago.  Interventions attempted: Nothing.  Symptoms are: unchanged.  Triage Disposition: See Physician Within 24 Hours  Patient/caregiver understands and will follow disposition?: Yes   Copied from CRM #[id number]. Topic: Clinical - Red Word Triage >> Jul 02, 2024  7:52 AM Emylou G wrote: [facility] that prompted transfer to Nurse Triage: lightheaded, dizzyness ( especially when driving ) he is on BP meds Reason for Disposition  [1] MODERATE dizziness (e.g., interferes with normal activities) AND [2] has NOT been evaluated by doctor (or NP/PA) for this  (Exception: Dizziness caused by heat exposure, sudden standing, or poor fluid intake.)  Answer Assessment - Initial Assessment Questions 1. DESCRIPTION: Describe your dizziness.     Dizziness, lightheadedness 2. LIGHTHEADED: Do you feel lightheaded? (e.g., somewhat faint, woozy, weak upon standing)     woozy 3. VERTIGO: Do you feel like either you or the room is spinning or tilting? (i.e., vertigo)     no 4. SEVERITY: How bad is it?  Do you feel like you are going to faint? Can you stand and walk?     Lasts for a few minutes then it stops & only happens while driving then goes away 5. ONSET:  When did the dizziness begin?     X week 6. AGGRAVATING FACTORS: Does anything make it worse? (e.g., standing, change in head position)     na 7. HEART RATE: Can you tell me your heart rate? How many beats in 15 seconds?  (Note: Not all patients can do this.)       na 8. CAUSE: What do you think is causing the dizziness? (e.g., decreased fluids or food, diarrhea, emotional distress, heat exposure, new medicine, sudden standing, vomiting; unknown)     unsure 9. RECURRENT SYMPTOM: Have  you had dizziness before? If Yes, ask: When was the last time? What happened that time?     na 10. OTHER SYMPTOMS: Do you have any other symptoms? (e.g., fever, chest pain, vomiting, diarrhea, bleeding)       no 11. PREGNANCY: Is there any chance you are pregnant? When was your last menstrual period?       na  Still taking BP med but taking at different time and pt noticed that having episodes of lightheadedness / dizziness that only happens when driving.  Pt states he has to pull over until the feeling goes away.  Pt requests to come in today for appt: appt scheduled  Protocols used: Dizziness - Lightheadedness-A-AH

## 2024-07-02 NOTE — Progress Notes (Signed)
 "  Subjective:    Patient ID: Logan Smith, male    DOB: 05-24-1971, 53 y.o.   MRN: [id number]   Chief Complaint: Dizziness (For around 2 weeks/)   Dizziness This is a new problem. The current episode started in the past 7 days. The problem occurs intermittently (only last a couple of seconds and is only happening when he is driving- did not have episode on way to doctors office). The problem has been waxing and waning. Pertinent negatives include no abdominal pain, chest pain, diaphoresis, headaches, rash or weakness. Nothing aggravates the symptoms. He has tried position changes for the symptoms. The treatment provided mild relief.   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  Eyes:  Negative for pain.  Respiratory:  Negative for shortness of breath.   Cardiovascular:  Negative for chest pain, palpitations and leg swelling.  Gastrointestinal:  Negative for abdominal pain.  Endocrine: Negative for polydipsia.  Skin:  Negative for rash.  Neurological:  Positive for dizziness. Negative for weakness and headaches.  Hematological:  Does not bruise/bleed easily.  All other systems reviewed and are negative.      Objective:    Physical Exam Constitutional:      Appearance: Normal appearance.  Cardiovascular:     Rate and Rhythm: Normal rate and regular rhythm.     Heart sounds: Normal heart sounds.  Pulmonary:     Breath sounds: Normal breath sounds.  Skin:    General: Skin is warm.  Neurological:     General: No focal deficit present.     Mental Status: He is alert and oriented to person, place, and time.     Cranial Nerves: No cranial nerve deficit.     Sensory: No sensory deficit.  Psychiatric:        Mood and Affect: Mood normal.        Behavior: Behavior normal.     BP (!) 159/79   Pulse 87   Temp 98.5 F (36.9 C) (Temporal)   Ht 6' (1.829 m)   Wt 290 lb (131.5 kg)   SpO2 98%   BMI 39.33 kg/m        Assessment & Plan:   Logan Smith in today with chief complaint of Dizziness (For around 2 weeks/)   1. Vertigo (Primary) Meclizine  may cause drossiness - meclizine  (ANTIVERT ) 25 MG tablet; Take 1 tablet (25 mg total) by mouth 3 (three) times daily as needed for dizziness.  Dispense: 30 tablet; Refill: 0  2. Primary hypertension Keep diary of blood pressure at home [place] resources Avoid [place] Increase lotensin  to 20/12.5 daily RTO prn - benazepril -hydrochlorthiazide (  LOTENSIN  HCT) 20-25 MG tablet; Take 1 tablet by mouth daily.  Dispense: 90 tablet; Refill: 1    The above assessment and management plan was discussed with the patient. The patient verbalized understanding of and has agreed to the management plan. Patient is aware to call the clinic if symptoms persist or worsen. Patient is aware when to return to the clinic for a follow-up visit. Patient educated on when it is appropriate to go to the emergency department.   Mary-Margaret Gladis, FNP   "

## 2024-07-06 ENCOUNTER — Emergency Department (HOSPITAL_COMMUNITY)
Admission: EM | Admit: 2024-07-06 | Discharge: 2024-07-06 | Disposition: A | Discharge status: Home / Self Care | Attending: Emergency Medicine | Admitting: Emergency Medicine

## 2024-07-06 ENCOUNTER — Encounter (HOSPITAL_COMMUNITY): Payer: Self-pay

## 2024-07-06 ENCOUNTER — Emergency Department (HOSPITAL_COMMUNITY)

## 2024-07-06 ENCOUNTER — Other Ambulatory Visit: Payer: Self-pay

## 2024-07-06 DIAGNOSIS — Z79899 Other long term (current) drug therapy: Secondary | ICD-10-CM | POA: Insufficient documentation

## 2024-07-06 DIAGNOSIS — R110 Nausea: Secondary | ICD-10-CM | POA: Diagnosis not present

## 2024-07-06 DIAGNOSIS — R0789 Other chest pain: Secondary | ICD-10-CM | POA: Diagnosis not present

## 2024-07-06 DIAGNOSIS — R079 Chest pain, unspecified: Secondary | ICD-10-CM | POA: Diagnosis present

## 2024-07-06 DIAGNOSIS — R42 Dizziness and giddiness: Secondary | ICD-10-CM | POA: Insufficient documentation

## 2024-07-06 DIAGNOSIS — R0602 Shortness of breath: Secondary | ICD-10-CM | POA: Insufficient documentation

## 2024-07-06 DIAGNOSIS — I10 Essential (primary) hypertension: Secondary | ICD-10-CM | POA: Insufficient documentation

## 2024-07-06 LAB — COMPREHENSIVE METABOLIC PANEL WITH GFR
ALT: 19 U/L (ref 0–44)
AST: 19 U/L (ref 15–41)
Albumin: 4.4 g/dL (ref 3.5–5.0)
Alkaline Phosphatase: 79 U/L (ref 38–126)
Anion gap: 14 (ref 5–15)
BUN: 16 mg/dL (ref 6–20)
CO2: 24 mmol/L (ref 22–32)
Calcium: 9.4 mg/dL (ref 8.9–10.3)
Chloride: 99 mmol/L (ref 98–111)
Creatinine, Ser: 0.81 mg/dL (ref 0.61–1.24)
GFR, Estimated: >60 mL/min
Glucose, Bld: 92 mg/dL (ref 70–99)
Potassium: 3.3 mmol/L — ABNORMAL LOW (ref 3.5–5.1)
Sodium: 137 mmol/L (ref 135–145)
Total Bilirubin: 0.4 mg/dL (ref 0.0–1.2)
Total Protein: 7.5 g/dL (ref 6.5–8.1)

## 2024-07-06 LAB — CBC WITH DIFFERENTIAL/PLATELET
Abs Immature Granulocytes: 0.03 K/uL (ref 0.00–0.07)
Basophils Absolute: 0 K/uL (ref 0.0–0.1)
Basophils Relative: 0 %
Eosinophils Absolute: 0.1 K/uL (ref 0.0–0.5)
Eosinophils Relative: 1 %
HCT: 42.5 % (ref 39.0–52.0)
Hemoglobin: 14.1 g/dL (ref 13.0–17.0)
Immature Granulocytes: 0 %
Lymphocytes Relative: 18 %
Lymphs Abs: 1.6 K/uL (ref 0.7–4.0)
MCH: 29.7 pg (ref 26.0–34.0)
MCHC: 33.2 g/dL (ref 30.0–36.0)
MCV: 89.7 fL (ref 80.0–100.0)
Monocytes Absolute: 0.6 K/uL (ref 0.1–1.0)
Monocytes Relative: 6 %
Neutro Abs: 6.7 K/uL (ref 1.7–7.7)
Neutrophils Relative %: 75 %
Platelets: 284 K/uL (ref 150–400)
RBC: 4.74 MIL/uL (ref 4.22–5.81)
RDW: 12.4 % (ref 11.5–15.5)
WBC: 9 K/uL (ref 4.0–10.5)
nRBC: 0 % (ref 0.0–0.2)

## 2024-07-06 LAB — TROPONIN T, HIGH SENSITIVITY
Troponin T High Sensitivity: <15 ng/L (ref 0–19)
Troponin T High Sensitivity: <15 ng/L (ref 0–19)

## 2024-07-06 MED ORDER — FAMOTIDINE 20 MG PO TABS: 20.0000 mg | ORAL_TABLET | Freq: Two times a day (BID) | ORAL | 0 refills | Status: DC

## 2024-07-06 NOTE — ED Notes (Signed)
 Patient transported to X-ray

## 2024-07-06 NOTE — Discharge Instructions (Addendum)
 You were evaluated in the emergency room for chest pain.  Your lab work and imaging did not show any significant abnormality.  Prescription for famotidine  was sent into your pharmacy to trial as acid reflux can mimic the symptoms.  Please follow with your primary care doctor for further evaluation.  If you experience any new or worsening symptoms please return to the emergency room.

## 2024-07-06 NOTE — ED Triage Notes (Signed)
 Patient arrives via [facility] c/c chest tightness than began 1 hr ago. Patient received 1 dose of nitroglycerin and 324 mg of ASA en route. EKG for EMS shows NSR. No cardiac history per patient.

## 2024-07-06 NOTE — ED Provider Notes (Signed)
 "  EMERGENCY DEPARTMENT AT [facility] Provider Note   CSN: [id number] Arrival date & time: 07/06/24  1534     Patient presents with: Chest Pain   Logan Smith is a 53 y.o. male with history of hypertension evaluated for chest tightness that started about an hour ago.  Not associate with any shortness of breath, dizziness or nausea.  Symptoms are nonexertional and constant.  Denies any chest pain with this chest tightness.  Has no cardiac history.  No history of PE.  No recent surgeries, travel or hospitalizations.    Chest Pain  Past Medical History:  Diagnosis Date   Back pain    Erectile dysfunction    Hyperlipidemia    Hypertension    Past Surgical History:  Procedure Laterality Date   COLONOSCOPY N/A 11/21/2020   Procedure: COLONOSCOPY;  Surgeon: Mavis Anes, MD;  Location: [facility];  Service: Gastroenterology;  Laterality: N/A;   HEMORROIDECTOMY     VASECTOMY         Prior to Admission medications  Medication Sig Start Date End Date Taking? Authorizing Provider  famotidine  (PEPCID ) 20 MG tablet Take 1 tablet (20 mg total) by mouth 2 (two) times daily. 07/06/24  Yes Donnajean Lynwood DEL, PA-C  amLODipine  (NORVASC ) 5 MG tablet Take 1 tablet (5 mg total) by mouth daily. 03/16/24   Lavell Bari LABOR, FNP  benazepril -hydrochlorthiazide (LOTENSIN  HCT) 20-25 MG tablet Take 1 tablet by mouth daily. 07/02/24   Gladis Mary-Margaret, FNP  cetirizine  (ZYRTEC ) 10 MG tablet Take 1 tablet (10 mg total) by mouth daily. 03/16/24   Lavell Bari LABOR, FNP  fish oil-omega-3 fatty acids 1000 MG capsule Take 2 g by mouth daily.    [provider]  guaifenesin  (HUMIBID E) 400 MG TABS tablet Take 1 tablet (400 mg total) by mouth every 4 (four) hours. 05/28/24   St Morton Sebastian Pool, NP  hydrocortisone  (ANUSOL -HC) 25 MG suppository Place 1 suppository (25 mg total) rectally 2 (two) times daily. Patient taking differently: Place 25 mg rectally 2 (two) times  daily as needed for hemorrhoids. 01/17/20   Lavell Bari LABOR, FNP  meclizine  (ANTIVERT ) 25 MG tablet Take 1 tablet (25 mg total) by mouth 3 (three) times daily as needed for dizziness. 07/02/24   Gladis Mary-Margaret, FNP  Multiple Vitamins-Minerals (MENS 50+ MULTI VITAMIN/MIN PO) Take 1 tablet by mouth daily.    [provider]  naproxen  (NAPROSYN ) 500 MG tablet Take 1 tablet (500 mg total) by mouth 2 (two) times daily with a meal. 10/14/21   Hawks, Christy A, FNP  pramoxine-hydrocortisone  (PROCTOCREAM-HC) 1-1 % rectal cream Place 1 application rectally 2 (two) times daily. Patient taking differently: Place 1 application  rectally 2 (two) times daily as needed for hemorrhoids. 10/02/19   Zollie Lowers, MD  rosuvastatin  (CRESTOR ) 10 MG tablet Take 1 tablet (10 mg total) by mouth daily. 03/16/24   Lavell Bari LABOR, FNP  sildenafil  (VIAGRA ) 25 MG tablet Take 1 tablet (25 mg total) by mouth daily as needed for erectile dysfunction. 03/16/24   Lavell Bari LABOR, FNP    Allergies: Patient has no known allergies.    Review of Systems  Cardiovascular:  Positive for chest pain.    Updated Vital Signs BP (!) 154/94 (BP Location: Left Arm)   Pulse 80   Temp 97.9 F (36.6 C) (Oral)   Resp 18   Ht 6' (1.829 m)   Wt 131.5 kg   SpO2 98%   BMI 39.33 kg/m  Physical Exam Vitals and nursing note reviewed.  Constitutional:      General: He is not in acute distress.    Appearance: He is well-developed.  HENT:     Head: Normocephalic and atraumatic.  Eyes:     Conjunctiva/sclera: Conjunctivae normal.  Cardiovascular:     Rate and Rhythm: Normal rate and regular rhythm.     Heart sounds: No murmur heard. Pulmonary:     Effort: Pulmonary effort is normal. No respiratory distress.     Breath sounds: Normal breath sounds.  Abdominal:     Palpations: Abdomen is soft.     Tenderness: There is no abdominal tenderness.  Musculoskeletal:        General: No swelling.     Cervical back: Neck  supple.  Skin:    General: Skin is warm and dry.     Capillary Refill: Capillary refill takes less than 2 seconds.  Neurological:     Mental Status: He is alert.  Psychiatric:        Mood and Affect: Mood normal.     (all labs ordered are listed, but only abnormal results are displayed) Labs Reviewed  COMPREHENSIVE METABOLIC PANEL WITH GFR - Abnormal; Notable for the following components:      Result Value   Potassium 3.3 (*)    All other components within normal limits  CBC WITH DIFFERENTIAL/PLATELET  TROPONIN T, HIGH SENSITIVITY  TROPONIN T, HIGH SENSITIVITY    EKG: None  Radiology: DG Chest Portable 1 View Result Date: 07/06/2024 CLINICAL DATA:  Chest pain. EXAM: PORTABLE CHEST 1 VIEW COMPARISON:  None Available. FINDINGS: Low lung volumes with associated accentuation of the cardiopericardial silhouette and bronchovascular crowding. No focal consolidation, sizeable pleural effusion, or pneumothorax. Asymmetric elevation of the right hemidiaphragm. No acute osseous abnormality. IMPRESSION: 1. Low lung volumes with bronchovascular crowding. Otherwise, no acute cardiopulmonary findings. 2. Asymmetric elevation of the right hemidiaphragm. Electronically Signed   By: Harrietta Sherry M.D.   On: 07/06/2024 17:53     Procedures   Medications Ordered in the ED - No data to display  Clinical Course as of 07/06/24 1949  Fri Jul 06, 2024  1713 Patient with history of hypertension evaluated for chest tightness that started about an hour ago.  Not associated with shortness of breath, dizziness or nausea.  Upon arrival patient is hypertensive otherwise hemodynamically stable.  He is nontoxic-appearing his exam is benign.  Will obtain routine cardiac workup. [JT]  1750 CBC with Differential Unremarkable [JT]  1750 Troponin T, High Sensitivity Without elevation [JT]  1750 Comprehensive metabolic panel(!) No significant normality [JT]  1751 ED EKG Normal sinus rhythm, nonspecific T  wave abnormality [JT]  1812 DG Chest Portable 1 View No acute abnormality [JT]  1949 Workup overall reassuring.  Etiology unclear at this time.  Patient be discharged home.  Is interested in a trial prescription for famotidine .  Encourage PCP follow-up.  Strict return precautions provided. [JT]    Clinical Course User Index [JT] Donnajean Lynwood DEL, PA-C                                 Medical Decision Making Amount and/or Complexity of Data Reviewed Labs: ordered. Decision-making details documented in ED Course. Radiology: ordered. Decision-making details documented in ED Course. ECG/medicine tests:  Decision-making details documented in ED Course.   This patient presents to the ED with chief complaint(s) of chest pain.  The  complaint involves an extensive differential diagnosis and also carries with it a high risk of complications and morbidity.   Pertinent past medical history as listed in HPI  The differential diagnosis includes  Do not suspect ACS as patient is without any ischemic changes on EKG and troponins without elevation.  No risk factors for PE.  Doubt dissection.  No evidence of pneumothorax or consolidation on x-ray. Additional history obtained: Additional history obtained from EMS  Records reviewed Care Everywhere/External Records  Disposition:   Patient will be discharged home. The patient has been appropriately medically screened and/or stabilized in the ED. I have low suspicion for any other emergent medical condition which would require further screening, evaluation or treatment in the ED or require inpatient management. At time of discharge the patient is hemodynamically stable and in no acute distress. I have discussed work-up results and diagnosis with patient and answered all questions. Patient is agreeable with discharge plan. We discussed strict return precautions for returning to the emergency department and they verbalized understanding.     Social Determinants  of Health:   none  This note was dictated with voice recognition software.  Despite best efforts at proofreading, errors may have occurred which can change the documentation meaning.       Final diagnoses:  Atypical chest pain    ED Discharge Orders          Ordered    famotidine  (PEPCID ) 20 MG tablet  2 times daily        07/06/24 1948               Mercedez Boule H, PA-C 07/06/24 1949    Patsey Lot, MD 07/07/24 0004  "

## 2024-07-09 ENCOUNTER — Encounter: Payer: Self-pay | Admitting: Family

## 2024-07-09 ENCOUNTER — Ambulatory Visit (INDEPENDENT_AMBULATORY_CARE_PROVIDER_SITE_OTHER): Admitting: Family

## 2024-07-09 ENCOUNTER — Telehealth: Payer: Self-pay | Admitting: Family Medicine

## 2024-07-09 ENCOUNTER — Ambulatory Visit: Payer: Self-pay

## 2024-07-09 VITALS — BP 150/81 | HR 88 | Temp 98.3°F | Ht 72.0 in | Wt 280.8 lb

## 2024-07-09 DIAGNOSIS — I10 Essential (primary) hypertension: Secondary | ICD-10-CM | POA: Diagnosis not present

## 2024-07-09 DIAGNOSIS — Z09 Encounter for follow-up examination after completed treatment for conditions other than malignant neoplasm: Secondary | ICD-10-CM

## 2024-07-09 DIAGNOSIS — R0789 Other chest pain: Secondary | ICD-10-CM

## 2024-07-09 MED ORDER — DICLOFENAC SODIUM 75 MG PO TBEC: 75.0000 mg | DELAYED_RELEASE_TABLET | Freq: Two times a day (BID) | ORAL | 0 refills | Status: AC

## 2024-07-09 MED ORDER — BENAZEPRIL-HYDROCHLOROTHIAZIDE 20-25 MG PO TABS: 1.0000 | ORAL_TABLET | Freq: Every day | ORAL | 1 refills | Status: AC

## 2024-07-09 MED ORDER — BACLOFEN 10 MG PO TABS: 10.0000 mg | ORAL_TABLET | Freq: Three times a day (TID) | ORAL | 0 refills | Status: AC

## 2024-07-09 MED ORDER — AMLODIPINE BESYLATE 10 MG PO TABS: 10.0000 mg | ORAL_TABLET | Freq: Every day | ORAL | 1 refills | Status: AC

## 2024-07-09 NOTE — Patient Instructions (Signed)
 Hypertension, Adult High blood pressure (hypertension) is when the force of blood pumping through the arteries is too strong. The arteries are the blood vessels that carry blood from the heart throughout the body. Hypertension forces the heart to work harder to pump blood and may cause arteries to become narrow or stiff. Untreated or uncontrolled hypertension can lead to a heart attack, heart failure, a stroke, kidney disease, and other problems. A blood pressure reading consists of a higher number over a lower number. Ideally, your blood pressure should be below 120/80. The first ("top") number is called the systolic pressure. It is a measure of the pressure in your arteries as your heart beats. The second ("bottom") number is called the diastolic pressure. It is a measure of the pressure in your arteries as the heart relaxes. What are the causes? The exact cause of this condition is not known. There are some conditions that result in high blood pressure. What increases the risk? Certain factors may make you more likely to develop high blood pressure. Some of these risk factors are under your control, including: Smoking. Not getting enough exercise or physical activity. Being overweight. Having too much fat, sugar, calories, or salt (sodium) in your diet. Drinking too much alcohol. Other risk factors include: Having a personal history of heart disease, diabetes, high cholesterol, or kidney disease. Stress. Having a family history of high blood pressure and high cholesterol. Having obstructive sleep apnea. Age. The risk increases with age. What are the signs or symptoms? High blood pressure may not cause symptoms. Very high blood pressure (hypertensive crisis) may cause: Headache. Fast or irregular heartbeats (palpitations). Shortness of breath. Nosebleed. Nausea and vomiting. Vision changes. Severe chest pain, dizziness, and seizures. How is this diagnosed? This condition is diagnosed by  measuring your blood pressure while you are seated, with your arm resting on a flat surface, your legs uncrossed, and your feet flat on the floor. The cuff of the blood pressure monitor will be placed directly against the skin of your upper arm at the level of your heart. Blood pressure should be measured at least twice using the same arm. Certain conditions can cause a difference in blood pressure between your right and left arms. If you have a high blood pressure reading during one visit or you have normal blood pressure with other risk factors, you may be asked to: Return on a different day to have your blood pressure checked again. Monitor your blood pressure at home for 1 week or longer. If you are diagnosed with hypertension, you may have other blood or imaging tests to help your health care provider understand your overall risk for other conditions. How is this treated? This condition is treated by making healthy lifestyle changes, such as eating healthy foods, exercising more, and reducing your alcohol intake. You may be referred for counseling on a healthy diet and physical activity. Your health care provider may prescribe medicine if lifestyle changes are not enough to get your blood pressure under control and if: Your systolic blood pressure is above 130. Your diastolic blood pressure is above 80. Your personal target blood pressure may vary depending on your medical conditions, your age, and other factors. Follow these instructions at home: Eating and drinking  Eat a diet that is high in fiber and potassium, and low in sodium, added sugar, and fat. An example of this eating plan is called the DASH diet. DASH stands for Dietary Approaches to Stop Hypertension. To eat this way: Eat  plenty of fresh fruits and vegetables. Try to fill one half of your plate at each meal with fruits and vegetables. Eat whole grains, such as whole-wheat pasta, brown rice, or whole-grain bread. Fill about one  fourth of your plate with whole grains. Eat or drink low-fat dairy products, such as skim milk or low-fat yogurt. Avoid fatty cuts of meat, processed or cured meats, and poultry with skin. Fill about one fourth of your plate with lean proteins, such as fish, chicken without skin, beans, eggs, or tofu. Avoid pre-made and processed foods. These tend to be higher in sodium, added sugar, and fat. Reduce your daily sodium intake. Many people with hypertension should eat less than 1,500 mg of sodium a day. Do not drink alcohol if: Your health care provider tells you not to drink. You are pregnant, may be pregnant, or are planning to become pregnant. If you drink alcohol: Limit how much you have to: 0-1 drink a day for women. 0-2 drinks a day for men. Know how much alcohol is in your drink. In the [place] one drink equals one 12 oz bottle of beer (355 mL), one 5 oz glass of wine (148 mL), or one 1 oz glass of hard liquor (44 mL). Lifestyle  Work with your health care provider to maintain a healthy body weight or to lose weight. Ask what an ideal weight is for you. Get at least 30 minutes of exercise that causes your heart to beat faster (aerobic exercise) most days of the week. Activities may include walking, swimming, or biking. Include exercise to strengthen your muscles (resistance exercise), such as Pilates or lifting weights, as part of your weekly exercise routine. Try to do these types of exercises for 30 minutes at least 3 days a week. Do not use any products that contain nicotine or tobacco. These products include cigarettes, chewing tobacco, and vaping devices, such as e-cigarettes. If you need help quitting, ask your health care provider. Monitor your blood pressure at home as told by your health care provider. Keep all follow-up visits. This is important. Medicines Take over-the-counter and prescription medicines only as told by your health care provider. Follow directions carefully. Blood  pressure medicines must be taken as prescribed. Do not skip doses of blood pressure medicine. Doing this puts you at risk for problems and can make the medicine less effective. Ask your health care provider about side effects or reactions to medicines that you should watch for. Contact a health care provider if you: Think you are having a reaction to a medicine you are taking. Have headaches that keep coming back (recurring). Feel dizzy. Have swelling in your ankles. Have trouble with your vision. Get help right away if you: Develop a severe headache or confusion. Have unusual weakness or numbness. Feel faint. Have severe pain in your chest or abdomen. Vomit repeatedly. Have trouble breathing. These symptoms may be an emergency. Get help right away. Call 911. Do not wait to see if the symptoms will go away. Do not drive yourself to the hospital. Summary Hypertension is when the force of blood pumping through your arteries is too strong. If this condition is not controlled, it may put you at risk for serious complications. Your personal target blood pressure may vary depending on your medical conditions, your age, and other factors. For most people, a normal blood pressure is less than 120/80. Hypertension is treated with lifestyle changes, medicines, or a combination of both. Lifestyle changes include losing weight, eating a healthy,  low-sodium diet, exercising more, and limiting alcohol. This information is not intended to replace advice given to you by your health care provider. Make sure you discuss any questions you have with your health care provider. Document Revised: 05/05/2021 Document Reviewed: 05/05/2021 Elsevier Patient Education  2024 [employer].

## 2024-07-09 NOTE — Telephone Encounter (Signed)
 Attempt # 1 to reach patient to triage symptoms. Left VM to call back    Message from Riesel B sent at 07/09/2024  8:52 AM EST  Summary: [id number]: Franky Slicker Chest Tightening.   Reason for Triage: pt is calling in after denying the virtual appt that was being offered to him previously, he stated the meds he is currently on doesn't seem to be working asked the pt was he experiencing chest tightness now, he stated no, then he stated yes, then he stated not at the moment, he mentioned the high blood pressure, but he stated the blood pressure seems to be going down its just the medications not working for the chest tightening.

## 2024-07-09 NOTE — Telephone Encounter (Addendum)
 Per pt chart, pt was seen since his call today in PCP office. No further action needed at this time.

## 2024-07-09 NOTE — Progress Notes (Signed)
 "  Subjective:    Patient ID: Logan Smith, male    DOB: Nov 16, 1970, 53 y.o.   MRN: [id number]  Chief Complaint  Patient presents with   er follow up   Pt presents to the office today for ED follow up. He went to the ED on 07/06/24 for left chest tightness that is worse when he sitting or driving.   Chest x-ray negative for acute cardiopulmonary findings. His troponins were negative.   He was given Pepcid  20 mg BID. This has not helped his chest pain.  Hypertension This is a chronic problem. The current episode started more than 1 year ago. The problem has been waxing and waning since onset. The problem is uncontrolled. Pertinent negatives include no malaise/fatigue, peripheral edema or shortness of breath. Risk factors for coronary artery disease include obesity and male gender. Past treatments include calcium  channel blockers, ACE inhibitors and diuretics. The current treatment provides mild improvement.      Review of Systems  Constitutional:  Negative for malaise/fatigue.  Respiratory:  Negative for shortness of breath.   All other systems reviewed and are negative.   Social History   Socioeconomic History   Marital status: Single    Spouse name: Not on file   Number of children: Not on file   Years of education: Not on file   Highest education level: Not on file  Occupational History   Not on file  Tobacco Use   Smoking status: Never   Smokeless tobacco: Never  Vaping Use   Vaping status: Never Used  Substance and Sexual Activity   Alcohol use: No    Comment: rarely   Drug use: No   Sexual activity: Not on file  Other Topics Concern   Not on file  Social History Narrative   Not on file   Social Drivers of Health   Tobacco Use: Low Risk (07/09/2024)   Patient History    Smoking Tobacco Use: Never    Smokeless Tobacco Use: Never    Passive Exposure: Not on file  Financial Resource Strain: Not on file  Food Insecurity: Not on file  Transportation Needs:  Not on file  Physical Activity: Not on file  Stress: Not on file  Social Connections: Not on file  Depression (PHQ2-9): Low Risk (07/09/2024)   Depression (PHQ2-9)    PHQ-2 Score: 0  Alcohol Screen: Not on file  Housing: Not on file  Utilities: Not on file  Health Literacy: Not on file   Family History  Problem Relation Age of Onset   Diabetes Mother    Hypertension Mother    Hypertension Father         Objective:   Physical Exam Vitals reviewed.  Constitutional:      General: He is not in acute distress.    Appearance: He is well-developed.  HENT:     Head: Normocephalic.     Right Ear: Tympanic membrane normal.     Left Ear: Tympanic membrane normal.  Eyes:     General:        Right eye: No discharge.        Left eye: No discharge.     Pupils: Pupils are equal, round, and reactive to light.  Neck:     Thyroid : No thyromegaly.  Cardiovascular:     Rate and Rhythm: Normal rate and regular rhythm.     Heart sounds: Normal heart sounds. No murmur heard. Pulmonary:     Effort: Pulmonary effort is normal. No  respiratory distress.     Breath sounds: Normal breath sounds. No wheezing.  Abdominal:     General: Bowel sounds are normal. There is no distension.     Palpations: Abdomen is soft.     Tenderness: There is no abdominal tenderness.  Musculoskeletal:        General: Tenderness present. Normal range of motion.       Arms:     Cervical back: Normal range of motion and neck supple.     Comments: Tenderness in left chest, improvement when he stands  Skin:    General: Skin is warm and dry.     Findings: No erythema or rash.  Neurological:     Mental Status: He is alert and oriented to person, place, and time.     Cranial Nerves: No cranial nerve deficit.     Deep Tendon Reflexes: Reflexes are normal and symmetric.  Psychiatric:        Behavior: Behavior normal.        Thought Content: Thought content normal.        Judgment: Judgment normal.       BP  (!) 150/81   Pulse 88   Temp 98.3 F (36.8 C) (Temporal)   Ht 6' (1.829 m)   Wt 280 lb 12.8 oz (127.4 kg)   SpO2 97%   BMI 38.08 kg/m      Assessment & Plan:  [name] comes in today with chief complaint of er follow up   Diagnosis and orders addressed:  1. Musculoskeletal chest pain (Primary) Start diclofenac  BID with food, no other NSAID's  Baclofen  TID prn, sedation precautions  ROM exercises  - diclofenac  (VOLTAREN ) 75 MG EC tablet; Take 1 tablet (75 mg total) by mouth 2 (two) times daily.  Dispense: 30 tablet; Refill: 0 - baclofen  (LIORESAL ) 10 MG tablet; Take 1 tablet (10 mg total) by mouth 3 (three) times daily.  Dispense: 60 each; Refill: 0 - BMP8+EGFR  2. Primary hypertension Increased to Norvasc  10 mg from 5 mg  -Dash diet information given -Exercise encouraged - Stress Management  -Continue current meds -RTO in 2 weeks  - amLODipine  (NORVASC ) 10 MG tablet; Take 1 tablet (10 mg total) by mouth daily.  Dispense: 90 tablet; Refill: 1 - benazepril -hydrochlorthiazide (LOTENSIN  HCT) 20-25 MG tablet; Take 1 tablet by mouth daily.  Dispense: 90 tablet; Refill: 1 - BMP8+EGFR  3. Hospital discharge follow-up Hospital notes reviewed  - BMP8+EGFR   Labs pending Continue current medications  Keep follow up with specialists  Health Maintenance reviewed Diet and exercise encouraged  Return in about 2 weeks (around 07/23/2024), or if symptoms worsen or fail to improve.    Bari Learn, FNP   "

## 2024-07-09 NOTE — Telephone Encounter (Unsigned)
 Copied from CRM [id number]. Topic: Appointments - Appointment Scheduling >> Jul 09, 2024  8:33 AM Cherylann RAMAN wrote: Patient/patient representative is calling to schedule an appointment. Refer to attachments for appointment information. Offered patient soonest appt and he declined it and requested to speak with someone in the office. Spoke with Jeanette and she stated there are no availability for in person appts and they are unable to override provider's schedule. The only same day appts that could be offered are Video appts. Informed patient of this and offered to schedule a video appt. Patient declined and stated that he is just going to go up to the office as they cannot deny him being seen. Informed CAL of patient's decision.

## 2024-07-09 NOTE — Telephone Encounter (Signed)
 Called patient he is coming in to see hawks today at 2

## 2024-07-10 ENCOUNTER — Telehealth: Payer: Self-pay | Admitting: Family

## 2024-07-10 MED ORDER — CELECOXIB 100 MG PO CAPS: 100.0000 mg | ORAL_CAPSULE | Freq: Two times a day (BID) | ORAL | 0 refills | Status: AC

## 2024-07-10 NOTE — Telephone Encounter (Signed)
 Can covering provider advise. Patient saw Bari Learn yesterday.   Copied from CRM #[id number]. Topic: Clinical - Medication Question >> Jul 10, 2024  9:51 AM Rosaria A wrote: Reason for CRM: Patient was seen yesterday and was prescribed diclofenac  (VOLTAREN ) 75 MG EC tablet for chest tightness. Patient went to the pharmacy to pick up the medication and was told by the Pharmacists not to get the prescription due to it having ibuprofen in it and that runs his blood pressure up. Patient is wanting something else called in for him that does not have ibuprofen in it. Patient would like a call back from a nurse.

## 2024-07-10 NOTE — Telephone Encounter (Signed)
 Pt calling about new rx with ibuprofen and is requesting a call back.

## 2024-08-30 ENCOUNTER — Encounter

## 2025-01-02 ENCOUNTER — Ambulatory Visit: Admitting: Urology

## 2025-03-19 ENCOUNTER — Encounter: Payer: Self-pay | Admitting: Family
